# Patient Record
Sex: Female | Born: 1985
Health system: Southern US, Community
[De-identification: ages and names within clinical notes are randomized; demographics above are authoritative.]

## PROBLEM LIST (undated history)

## (undated) ENCOUNTER — Inpatient Hospital Stay (HOSPITAL_COMMUNITY): Payer: Self-pay

## (undated) DIAGNOSIS — R519 Headache, unspecified: Secondary | ICD-10-CM

## (undated) DIAGNOSIS — G971 Other reaction to spinal and lumbar puncture: Secondary | ICD-10-CM

## (undated) DIAGNOSIS — K219 Gastro-esophageal reflux disease without esophagitis: Secondary | ICD-10-CM

## (undated) DIAGNOSIS — R51 Headache: Secondary | ICD-10-CM

## (undated) DIAGNOSIS — D649 Anemia, unspecified: Secondary | ICD-10-CM

## (undated) DIAGNOSIS — Z905 Acquired absence of kidney: Secondary | ICD-10-CM

## (undated) HISTORY — DX: Acquired absence of kidney: Z90.5

## (undated) HISTORY — PX: CERVICAL CERCLAGE: SHX1329

## (undated) HISTORY — DX: Headache, unspecified: R51.9

## (undated) HISTORY — DX: Headache: R51

## (undated) HISTORY — DX: Gastro-esophageal reflux disease without esophagitis: K21.9

## (undated) HISTORY — PX: INDUCED ABORTION: SHX677

## (undated) HISTORY — DX: Other reaction to spinal and lumbar puncture: G97.1

---

## 2005-04-30 HISTORY — PX: APPENDECTOMY: SHX54

## 2011-08-11 ENCOUNTER — Inpatient Hospital Stay (HOSPITAL_COMMUNITY)
Admission: AD | Admit: 2011-08-11 | Payer: BLUE CROSS/BLUE SHIELD | Source: Ambulatory Visit | Admitting: Obstetrics and Gynecology

## 2014-04-30 HISTORY — PX: KIDNEY DONATION: SHX685

## 2014-12-30 DIAGNOSIS — Z905 Acquired absence of kidney: Secondary | ICD-10-CM

## 2014-12-30 HISTORY — DX: Acquired absence of kidney: Z90.5

## 2016-12-13 ENCOUNTER — Encounter (INDEPENDENT_AMBULATORY_CARE_PROVIDER_SITE_OTHER): Payer: Self-pay

## 2016-12-13 ENCOUNTER — Other Ambulatory Visit (INDEPENDENT_AMBULATORY_CARE_PROVIDER_SITE_OTHER): Payer: PRIVATE HEALTH INSURANCE

## 2016-12-13 ENCOUNTER — Encounter: Payer: Self-pay | Admitting: Gastroenterology

## 2016-12-13 ENCOUNTER — Ambulatory Visit (INDEPENDENT_AMBULATORY_CARE_PROVIDER_SITE_OTHER): Payer: PRIVATE HEALTH INSURANCE | Admitting: Gastroenterology

## 2016-12-13 VITALS — BP 104/50 | HR 76 | Ht 65.0 in | Wt 160.4 lb

## 2016-12-13 DIAGNOSIS — R1032 Left lower quadrant pain: Secondary | ICD-10-CM

## 2016-12-13 DIAGNOSIS — R142 Eructation: Secondary | ICD-10-CM

## 2016-12-13 DIAGNOSIS — R194 Change in bowel habit: Secondary | ICD-10-CM | POA: Diagnosis not present

## 2016-12-13 DIAGNOSIS — R112 Nausea with vomiting, unspecified: Secondary | ICD-10-CM

## 2016-12-13 LAB — CBC WITH DIFFERENTIAL/PLATELET
BASOS ABS: 0 10*3/uL (ref 0.0–0.1)
Basophils Relative: 0.6 % (ref 0.0–3.0)
EOS ABS: 0.2 10*3/uL (ref 0.0–0.7)
Eosinophils Relative: 3 % (ref 0.0–5.0)
HCT: 35 % — ABNORMAL LOW (ref 36.0–46.0)
Hemoglobin: 11.4 g/dL — ABNORMAL LOW (ref 12.0–15.0)
LYMPHS ABS: 2.1 10*3/uL (ref 0.7–4.0)
LYMPHS PCT: 31.1 % (ref 12.0–46.0)
MCHC: 32.6 g/dL (ref 30.0–36.0)
MCV: 83.8 fl (ref 78.0–100.0)
MONOS PCT: 10.4 % (ref 3.0–12.0)
Monocytes Absolute: 0.7 10*3/uL (ref 0.1–1.0)
NEUTROS PCT: 54.9 % (ref 43.0–77.0)
Neutro Abs: 3.7 10*3/uL (ref 1.4–7.7)
Platelets: 328 10*3/uL (ref 150.0–400.0)
RBC: 4.17 Mil/uL (ref 3.87–5.11)
RDW: 14.1 % (ref 11.5–15.5)
WBC: 6.7 10*3/uL (ref 4.0–10.5)

## 2016-12-13 LAB — COMPREHENSIVE METABOLIC PANEL
ALK PHOS: 63 U/L (ref 39–117)
ALT: 12 U/L (ref 0–35)
AST: 12 U/L (ref 0–37)
Albumin: 3.8 g/dL (ref 3.5–5.2)
BILIRUBIN TOTAL: 0.2 mg/dL (ref 0.2–1.2)
BUN: 10 mg/dL (ref 6–23)
CO2: 24 mEq/L (ref 19–32)
CREATININE: 0.89 mg/dL (ref 0.40–1.20)
Calcium: 8.9 mg/dL (ref 8.4–10.5)
Chloride: 107 mEq/L (ref 96–112)
GFR: 93.97 mL/min (ref >60.00)
GLUCOSE: 87 mg/dL (ref 70–99)
Potassium: 3.9 mEq/L (ref 3.5–5.1)
Sodium: 136 mEq/L (ref 135–145)
TOTAL PROTEIN: 6.8 g/dL (ref 6.0–8.3)

## 2016-12-13 LAB — H. PYLORI ANTIBODY, IGG: H Pylori IgG: NEGATIVE

## 2016-12-13 MED ORDER — ONDANSETRON HCL 4 MG PO TABS: 4.0000 mg | ORAL_TABLET | Freq: Three times a day (TID) | ORAL | 1 refills | Status: DC | PRN

## 2016-12-13 MED ORDER — NA SULFATE-K SULFATE-MG SULF 17.5-3.13-1.6 GM/177ML PO SOLN: 1.0000 | Freq: Once | ORAL | 0 refills | Status: AC

## 2016-12-13 NOTE — Patient Instructions (Signed)
If you are age 33 or older, your body mass index should be between 23-30. Your Body mass index is 26.69 kg/m. If this is out of the aforementioned range listed, please consider follow up with your Primary Care Provider.  If you are age 33 or younger, your body mass index should be between 19-25. Your Body mass index is 26.69 kg/m. If this is out of the aformentioned range listed, please consider follow up with your Primary Care Provider.   You have been scheduled for an endoscopy and colonoscopy. Please follow the written instructions given to you at your visit today. Please pick up your prep supplies at the pharmacy within the next 1-3 days. If you use inhalers (even only as needed), please bring them with you on the day of your procedure. Your physician has requested that you go to www.startemmi.com and enter the access code given to you at your visit today. This web site gives a general overview about your procedure. However, you should still follow specific instructions given to you by our office regarding your preparation for the procedure.  Thank you for choosing [facility]  Dr Amada JupiterHenry Danis III

## 2016-12-13 NOTE — Progress Notes (Addendum)
[facility] Consult Note:  History: Rita Collier 12/13/2016  Referring physician: self referred  Reason for consult/chief complaint: burping (every day all day); Abdominal Pain (LLQ); Bloated (most days); Gas (every day); Nausea; and Emesis   Subjective  HPI:  This is a 31 year old woman self-referred to establish care for a multitude of chronic GI symptoms. She and her husband recently relocated here while he is studying Radio broadcast assistant. She reports the onset several years ago of abdominal bloating, left lower quadrant pain, alternating bowel habits, frequent and almost constant belching, increased gas and flatulence. She recalls the belching getting much worse after surgery to donate a kidney in 2016. She recalls being sent to a GI doctor who gave her some dietary advice but did note testing. Rita Collier recently has felt somewhat more constipated with feelings of incomplete evacuation. There's been no rectal bleeding. In addition, she wonders if it is related that her menstrual cycle seemed to have slowed down from every 28 to about every 42 days over the last several months. A few days ago she started becoming somewhat more nauseated and had a few episodes of bilious vomiting.  ROS:  Review of Systems  Constitutional: Negative for appetite change and unexpected weight change.  HENT: Negative for mouth sores and voice change.   Eyes: Negative for pain and redness.  Respiratory: Negative for cough and shortness of breath.   Cardiovascular: Negative for chest pain and palpitations.  Genitourinary: Negative for dysuria and hematuria.  Musculoskeletal: Negative for arthralgias and myalgias.  Skin: Negative for pallor and rash.  Neurological: Negative for weakness and headaches.  Hematological: Negative for adenopathy.     Past Medical History: Past Medical History:  Diagnosis Date  . GERD (gastroesophageal reflux disease)   . Hx of kidney removal 12/2014      Past Surgical History: Past Surgical History:  Procedure Laterality Date  . APPENDECTOMY  2007  . KIDNEY DONATION  2016     Family History: Family History  Problem Relation Age of Onset  . Stomach cancer Maternal Aunt   . Pancreatic cancer Maternal Uncle     Social History: Social History   Social History  . Marital status: Married    Spouse name: N/A  . Number of children: N/A  . Years of education: N/A   Social History Main Topics  . Smoking status: Never Smoker  . Smokeless tobacco: Never Used  . Alcohol use Yes     Comment: 1 to 2 drinks in a week  . Drug use: Unknown  . Sexual activity: Yes   Other Topics Concern  . None   Social History Narrative  . None    Allergies: Not on File  Outpatient Meds: Current Outpatient Prescriptions  Medication Sig Dispense Refill  . omeprazole (PRILOSEC) 20 MG capsule Take 20 mg by mouth daily.    . Na Sulfate-K Sulfate-Mg Sulf 17.5-3.13-1.6 GM/180ML SOLN Take 1 kit by mouth once. 354 mL 0  . ondansetron (ZOFRAN) 4 MG tablet Take 1 tablet (4 mg total) by mouth every 8 (eight) hours as needed for nausea or vomiting. 30 tablet 1   No current facility-administered medications for this visit.    Omeprazole had not been helpful in the past for these symptoms, and it has not helped over the last several days since she resumed it.   ___________________________________________________________________ Objective   Exam:  BP (!) 104/50   Pulse 76   Ht _0  (1.651 m)   Wt 160 lb  6.4 oz (72.8 kg)   LMP 11/03/2016 (Exact Date)   BMI 26.69 kg/m  Her husband is present for the entire encounter. [name] belches several times during the visit  General: this is a(n) young woman who does not appear malnourished and is in no acute distress, though with a somewhat depressed affect, perhaps from not feeling well.   Eyes: sclera anicteric, no redness  ENT: oral mucosa moist without lesions, no cervical or supraclavicular  lymphadenopathy, good dentition  CV: RRR without murmur, S1/S2, no JVD, no peripheral edema  Resp: clear to auscultation bilaterally, normal RR and effort noted  GI: soft, mild bilateral lower tenderness, with active bowel sounds. No guarding or palpable organomegaly noted.  Skin; warm and dry, no rash or jaundice noted  Neuro: awake, alert and oriented x 3. Normal gross motor function and fluent speech  No data for review  Assessment: Encounter Diagnoses  Name Primary?  Marland Kitchen LLQ abdominal pain Yes  . Belching   . Non-intractable vomiting with nausea, unspecified vomiting type   . Altered bowel habits     She has a constellation of GI symptoms that are difficult to put together in a single diagnosis. Overall, I think this favors a functional bowel disorder. Things seem to have worsened lately for unclear reasons and her apparently having a significant impact on her life. Therefore, further workup will be undertaken to look for H. pylori, gastric ulcer, celiac sprue, inflammatory bowel disease or, less likely, neoplasia.  Plan:  CBC, CMP, H. pylori antibody EGD and colonoscopy  The benefits and risks of the planned procedure were described in detail with the patient or (when appropriate) their health care proxy.  Risks were outlined as including, but not limited to, bleeding, infection, perforation, adverse medication reaction leading to cardiac or pulmonary decompensation, or pancreatitis (if ERCP).  The limitation of incomplete mucosal visualization was also discussed.  No guarantees or warranties were given.  Zofran 4 mg every 8 hours as needed  Nelida Meuse III

## 2016-12-26 ENCOUNTER — Encounter: Payer: PRIVATE HEALTH INSURANCE | Admitting: Gastroenterology

## 2017-01-10 ENCOUNTER — Emergency Department (HOSPITAL_COMMUNITY)
Admission: EM | Admit: 2017-01-10 | Discharge: 2017-01-11 | Disposition: A | Discharge status: Home / Self Care | Payer: 59 | Attending: Emergency Medicine | Admitting: Emergency Medicine

## 2017-01-10 ENCOUNTER — Encounter (HOSPITAL_COMMUNITY): Payer: Self-pay

## 2017-01-10 DIAGNOSIS — Z885 Allergy status to narcotic agent status: Secondary | ICD-10-CM | POA: Diagnosis not present

## 2017-01-10 DIAGNOSIS — Z3A10 10 weeks gestation of pregnancy: Secondary | ICD-10-CM | POA: Insufficient documentation

## 2017-01-10 DIAGNOSIS — O210 Mild hyperemesis gravidarum: Secondary | ICD-10-CM | POA: Diagnosis not present

## 2017-01-10 DIAGNOSIS — R1110 Vomiting, unspecified: Secondary | ICD-10-CM | POA: Diagnosis present

## 2017-01-10 LAB — COMPREHENSIVE METABOLIC PANEL
ALBUMIN: 3.6 g/dL (ref 3.5–5.0)
ALK PHOS: 54 U/L (ref 38–126)
ALT: 15 U/L (ref 14–54)
ANION GAP: 7 (ref 5–15)
AST: 17 U/L (ref 15–41)
BUN: 9 mg/dL (ref 6–20)
CO2: 20 mmol/L — AB (ref 22–32)
Calcium: 9.2 mg/dL (ref 8.9–10.3)
Chloride: 108 mmol/L (ref 101–111)
Creatinine, Ser: 0.93 mg/dL (ref 0.44–1.00)
GFR calc Af Amer: >60 mL/min (ref >60)
GFR calc non Af Amer: >60 mL/min (ref >60)
GLUCOSE: 83 mg/dL (ref 65–99)
POTASSIUM: 3.9 mmol/L (ref 3.5–5.1)
SODIUM: 135 mmol/L (ref 135–145)
Total Bilirubin: 0.5 mg/dL (ref 0.3–1.2)
Total Protein: 7.3 g/dL (ref 6.5–8.1)

## 2017-01-10 LAB — URINALYSIS, ROUTINE W REFLEX MICROSCOPIC
BILIRUBIN URINE: NEGATIVE
GLUCOSE, UA: NEGATIVE mg/dL
HGB URINE DIPSTICK: NEGATIVE
Ketones, ur: 5 mg/dL — AB
Leukocytes, UA: NEGATIVE
Nitrite: NEGATIVE
PH: 6 (ref 5.0–8.0)
Protein, ur: NEGATIVE mg/dL
SPECIFIC GRAVITY, URINE: 1.011 (ref 1.005–1.030)

## 2017-01-10 LAB — CBC
HCT: 35.5 % — ABNORMAL LOW (ref 36.0–46.0)
HEMOGLOBIN: 11.7 g/dL — AB (ref 12.0–15.0)
MCH: 26.8 pg (ref 26.0–34.0)
MCHC: 33 g/dL (ref 30.0–36.0)
MCV: 81.4 fL (ref 78.0–100.0)
Platelets: 337 10*3/uL (ref 150–400)
RBC: 4.36 MIL/uL (ref 3.87–5.11)
RDW: 13.6 % (ref 11.5–15.5)
WBC: 10.1 10*3/uL (ref 4.0–10.5)

## 2017-01-10 LAB — HCG, QUANTITATIVE, PREGNANCY: hCG, Beta Chain, Quant, S: 255512 m[IU]/mL — ABNORMAL HIGH (ref <5)

## 2017-01-10 LAB — I-STAT BETA HCG BLOOD, ED (MC, WL, AP ONLY): I-stat hCG, quantitative: >2000 m[IU]/mL — ABNORMAL HIGH (ref <5)

## 2017-01-10 LAB — LIPASE, BLOOD: Lipase: 79 U/L — ABNORMAL HIGH (ref 11–51)

## 2017-01-10 MED ORDER — SODIUM CHLORIDE 0.9 % IV BOLUS (SEPSIS)
1000.0000 mL | Freq: Once | INTRAVENOUS | Status: AC
  Administered 2017-01-10: 1000 mL via INTRAVENOUS

## 2017-01-10 MED ORDER — PROMETHAZINE HCL 25 MG/ML IJ SOLN
6.2500 mg | Freq: Once | INTRAMUSCULAR | Status: AC
  Administered 2017-01-10: 6.25 mg via INTRAVENOUS
  Filled 2017-01-10: qty 1

## 2017-01-10 NOTE — ED Provider Notes (Signed)
[facility] Provider Note   CSN: [id number] Arrival date & time: 01/10/17  1806     History   Chief Complaint Chief Complaint  Patient presents with  . Emesis    HPI Rita Collier is a 31 y.o. female.  HPI Patient is around [redacted] weeks pregnant. Has seen OB for it. Has had ultrasound. States for most a pregnancy she has been sick. Nausea and vomiting with occasional diarrhea. States she's keeping hot foods down but not really keep much liquids. She's tried doing just little bit of it. Has been on likely just without much help. She has some dull abdominal pain. States she feels as if she is more dehydrated headache now. Slight abdominal pain. No sick contacts. Is not feeling baby move yet. States she cannot tell if what she's feeling his diarrhea or the baby. Past Medical History:  Diagnosis Date  . GERD (gastroesophageal reflux disease)   . Hx of kidney removal 12/2014    There are no active problems to display for this patient.   Past Surgical History:  Procedure Laterality Date  . APPENDECTOMY  2007  . KIDNEY DONATION  2016    OB History    Gravida Para Term Preterm AB Living   1             SAB TAB Ectopic Multiple Live Births                   Home Medications    Prior to Admission medications   Medication Sig Start Date End Date Taking? Authorizing Provider  DICLEGIS 10-10 MG TBEC Take 10 mg by mouth. 01/01/17  Yes [provider]  ranitidine (ZANTAC) 150 MG tablet Take 150 mg by mouth daily.   Yes [provider]  promethazine (PHENERGAN) 25 MG tablet Take 1 tablet (25 mg total) by mouth every 6 (six) hours as needed for nausea or vomiting. 01/11/17   Benjiman Core, MD    Family History Family History  Problem Relation Age of Onset  . Stomach cancer Maternal Aunt   . Pancreatic cancer Maternal Uncle     Social History Social History  Substance Use Topics  . Smoking status: Never Smoker  . Smokeless tobacco: Never Used  .  Alcohol use Yes     Comment: occ when not pregnant     Allergies   Vicodin [hydrocodone-acetaminophen]   Review of Systems Review of Systems  Constitutional: Positive for appetite change.  Gastrointestinal: Negative for abdominal pain.  Genitourinary: Negative for vaginal bleeding and vaginal discharge.     Physical Exam Updated Vital Signs BP 108/67 (BP Location: Right Arm)   Pulse 95   Temp 98.3 F (36.8 C) (Oral)   Resp 18   Ht  (1.651 m)   Wt 72.6 kg (160 lb)   LMP 11/03/2016 (Exact Date)   SpO2 100%   BMI 26.63 kg/m   Physical Exam  Constitutional: She appears well-developed.  HENT:  Head: Atraumatic.  Eyes: Pupils are equal, round, and reactive to light.  Neck: Neck supple.  Cardiovascular: Normal rate.   Abdominal: There is no tenderness.  Musculoskeletal: Normal range of motion. She exhibits no edema.  Neurological: She is alert.  Skin: Skin is warm. Capillary refill takes less than 2 seconds.  Psychiatric: She has a normal mood and affect.     ED Treatments / Results  Labs (all labs ordered are listed, but only abnormal results are displayed) Labs Reviewed  LIPASE, BLOOD -  Abnormal; Notable for the following:       Result Value   Lipase 79 (*)    All other components within normal limits  COMPREHENSIVE METABOLIC PANEL - Abnormal; Notable for the following:    CO2 20 (*)    All other components within normal limits  CBC - Abnormal; Notable for the following:    Hemoglobin 11.7 (*)    HCT 35.5 (*)    All other components within normal limits  URINALYSIS, ROUTINE W REFLEX MICROSCOPIC - Abnormal; Notable for the following:    Color, Urine STRAW (*)    Ketones, ur 5 (*)    All other components within normal limits  HCG, QUANTITATIVE, PREGNANCY - Abnormal; Notable for the following:    hCG, Beta Chain, Quant, S [id number],512 (*)    All other components within normal limits  I-STAT BETA HCG BLOOD, ED ([facility], [facility], [facility] ONLY) - Abnormal; Notable for the  following:    I-stat hCG, quantitative >2,000.0 (*)    All other components within normal limits    EKG  EKG Interpretation None       Radiology No results found.  Procedures Procedures (including critical care time)  Medications Ordered in ED Medications  sodium chloride 0.9 % bolus 1,000 mL (0 mLs Intravenous Stopped 01/10/17 2351)  sodium chloride 0.9 % bolus 1,000 mL (0 mLs Intravenous Stopped 01/10/17 2351)  promethazine (PHENERGAN) injection 6.25 mg (6.25 mg Intravenous Given 01/10/17 2307)     Initial Impression / Assessment and Plan / ED Course  I have reviewed the triage vital signs and the nursing notes.  Pertinent labs & imaging results that were available during my care of the patient were reviewed by me and considered in my medical decision making (see chart for details).     Patient with nausea and vomiting. [redacted] weeks pregnant. Has not been able keep up at home. Labs reassuring but appears to be a hyperemesis. Has not done well with Diclegis. Feels better after some IV fluid. Does tolerate some orals and hopefully will be able be discharged home. Will give Phenergan since she has not been doing well with the other antiemetics. Follow-up with her OB.  Fin obstetrician.al Clinical Impressions(s) / ED Diagnoses   Final diagnoses:  Hyperemesis gravidarum    New Prescriptions New Prescriptions   PROMETHAZINE (PHENERGAN) 25 MG TABLET    Take 1 tablet (25 mg total) by mouth every 6 (six) hours as needed for nausea or vomiting.     Benjiman CorePickering, Xena Propst, MD 01/11/17 [phone]

## 2017-01-10 NOTE — ED Triage Notes (Signed)
Pt endorses being [redacted] weeks pregnant, pt has been unable to keep water down today and has states "I've been sick my whole pregnancy so far" Pt was able to keep food down but no fluids. VSS.

## 2017-01-11 MED ORDER — PROMETHAZINE HCL 25 MG PO TABS: 25.0000 mg | ORAL_TABLET | Freq: Four times a day (QID) | ORAL | 0 refills | Status: DC | PRN

## 2017-01-11 NOTE — ED Provider Notes (Signed)
Care assumed from Dr. Rubin Payor MD, at shift change, please see their notes for full documentation of patient's complaint/HPI. Briefly, pt here with hyperemesis gravidarum in early pregnancy. Results so far show U/A with few ketones but no evidence of infection; lipase marginally elevated at 79; CMP essentially WNL aside from mildly low bicarb 20; CBC with mild anemia which is stable; and quantHCG 255,512. Awaiting pt to receive second liter of fluids and as long as she's able to tolerate PO then she can be discharged with phenergan rx and OBGYN f/up.   Physical Exam  BP 108/67 (BP Location: Right Arm)   Pulse 95   Temp 98.3 F (36.8 C) (Oral)   Resp 18   Ht  (1.651 m)   Wt 72.6 kg (160 lb)   LMP 11/03/2016 (Exact Date)   SpO2 100%   BMI 26.63 kg/m   Physical Exam Gen: afebrile, VSS, NAD HEENT: EOMI, MMM Resp: no resp distress CV: rate WNL Abd: appearance normal, nondistended MsK: moving all extremities with ease Neuro: A&O x4  ED Course  Procedures Results for orders placed or performed during the hospital encounter of 01/10/17  Lipase, blood  Result Value Ref Range   Lipase 79 (H) 11 - 51 U/L  Comprehensive metabolic panel  Result Value Ref Range   Sodium 135 135 - 145 mmol/L   Potassium 3.9 3.5 - 5.1 mmol/L   Chloride 108 101 - 111 mmol/L   CO2 20 (L) 22 - 32 mmol/L   Glucose, Bld 83 65 - 99 mg/dL   BUN 9 6 - 20 mg/dL   Creatinine, Ser 4.74 0.44 - 1.00 mg/dL   Calcium 9.2 8.9 - 25.9 mg/dL   Total Protein 7.3 6.5 - 8.1 g/dL   Albumin 3.6 3.5 - 5.0 g/dL   AST 17 15 - 41 U/L   ALT 15 14 - 54 U/L   Alkaline Phosphatase 54 38 - 126 U/L   Total Bilirubin 0.5 0.3 - 1.2 mg/dL   GFR calc non Af Amer >60 >60 mL/min   GFR calc Af Amer >60 >60 mL/min   Anion gap 7 5 - 15  CBC  Result Value Ref Range   WBC 10.1 4.0 - 10.5 K/uL   RBC 4.36 3.87 - 5.11 MIL/uL   Hemoglobin 11.7 (L) 12.0 - 15.0 g/dL   HCT 56.3 (L) 87.5 - 64.3 %   MCV 81.4 78.0 - 100.0 fL   MCH 26.8 26.0  - 34.0 pg   MCHC 33.0 30.0 - 36.0 g/dL   RDW 32.9 51.8 - 84.1 %   Platelets 337 150 - 400 K/uL  Urinalysis, Routine w reflex microscopic  Result Value Ref Range   Color, Urine STRAW (A) YELLOW   APPearance CLEAR CLEAR   Specific Gravity, Urine 1.011 1.005 - 1.030   pH 6.0 5.0 - 8.0   Glucose, UA NEGATIVE NEGATIVE mg/dL   Hgb urine dipstick NEGATIVE NEGATIVE   Bilirubin Urine NEGATIVE NEGATIVE   Ketones, ur 5 (A) NEGATIVE mg/dL   Protein, ur NEGATIVE NEGATIVE mg/dL   Nitrite NEGATIVE NEGATIVE   Leukocytes, UA NEGATIVE NEGATIVE  hCG, quantitative, pregnancy  Result Value Ref Range   hCG, Beta Chain, Quant, S 255,512 (H) <5 mIU/mL  I-Stat beta hCG blood, ED  Result Value Ref Range   I-stat hCG, quantitative >2,000.0 (H) <5 mIU/mL   Comment 3           No results found.   Meds ordered this encounter  Medications  .  DICLEGIS 10-10 MG TBEC    Sig: Take 10 mg by mouth.    Refill:  0  . ranitidine (ZANTAC) 150 MG tablet    Sig: Take 150 mg by mouth daily.  . sodium chloride 0.9 % bolus 1,000 mL  . sodium chloride 0.9 % bolus 1,000 mL  . promethazine (PHENERGAN) injection 6.25 mg  . promethazine (PHENERGAN) 25 MG tablet    Sig: Take 1 tablet (25 mg total) by mouth every 6 (six) hours as needed for nausea or vomiting.    Dispense:  20 tablet    Refill:  0     MDM:   ICD-10-CM   1. Hyperemesis gravidarum O21.0     12:47 AM Second liter of fluids finished, pt tolerating PO well. D/c home with previously outlined plan per Dr. Rubin Payor. F/up with OBGYN in 2-3 days for ongoing management and recheck of symptoms. Phenergan rx written by Dr. Rubin Payor. I explained the diagnosis and have given explicit precautions to return to the ER including for any other new or worsening symptoms. The patient understands and accepts the medical plan as it's been dictated and I have answered their questions. Discharge instructions concerning home care and prescriptions have been given. The patient  is STABLE and is discharged to home in good condition.     [address], [place], [place] 01/11/17 1610    Benjiman Core, MD 01/13/17 [phone]

## 2017-01-11 NOTE — Discharge Instructions (Addendum)
Use phenergan as directed as needed for nausea. Stay well hydrated. Follow up with your OBGYN in 2-3 days for recheck of symptoms. Use the list of foods below to help with nausea/vomiting of pregnancy. [facility] for emergent changes or worsening symptoms.

## 2017-01-11 NOTE — ED Notes (Signed)
Pt given PO fluids per EDP order. Pt tolerating fluids well. Showing NAD.

## 2017-01-31 LAB — OB RESULTS CONSOLE ABO/RH: RH Type: POSITIVE

## 2017-01-31 LAB — OB RESULTS CONSOLE HIV ANTIBODY (ROUTINE TESTING): HIV: NONREACTIVE

## 2017-01-31 LAB — OB RESULTS CONSOLE GC/CHLAMYDIA
CHLAMYDIA, DNA PROBE: NEGATIVE
Gonorrhea: NEGATIVE

## 2017-01-31 LAB — OB RESULTS CONSOLE RPR: RPR: NONREACTIVE

## 2017-01-31 LAB — OB RESULTS CONSOLE RUBELLA ANTIBODY, IGM: Rubella: IMMUNE

## 2017-01-31 LAB — OB RESULTS CONSOLE ANTIBODY SCREEN: Antibody Screen: NEGATIVE

## 2017-01-31 LAB — OB RESULTS CONSOLE HEPATITIS B SURFACE ANTIGEN: HEP B S AG: NEGATIVE

## 2017-04-18 DIAGNOSIS — O3432 Maternal care for cervical incompetence, second trimester: Secondary | ICD-10-CM | POA: Diagnosis not present

## 2017-04-18 DIAGNOSIS — Z3A23 23 weeks gestation of pregnancy: Secondary | ICD-10-CM | POA: Diagnosis not present

## 2017-04-20 DIAGNOSIS — G971 Other reaction to spinal and lumbar puncture: Secondary | ICD-10-CM | POA: Diagnosis not present

## 2017-04-30 NOTE — L&D Delivery Note (Signed)
Delivery Note At 2:13 PM a viable female was delivered via Vaginal, Spontaneous (Presentation: LOA).  APGAR: 9, 9; weight pending.   Placenta status: S, I. 3V Cord  with the following complications: loose nuchal x 1, reduced.  Cord pH: n/a  Anesthesia:  CLEA Episiotomy: None Lacerations: None Suture Repair: n/a Est. Blood Loss (mL): 150  Mom to postpartum.  Baby to Couplet care / Skin to Skin.  Meoshia Billing 08/06/2017, 2:38 PM

## 2017-05-01 DIAGNOSIS — O3432 Maternal care for cervical incompetence, second trimester: Secondary | ICD-10-CM | POA: Diagnosis not present

## 2017-05-01 DIAGNOSIS — D509 Iron deficiency anemia, unspecified: Secondary | ICD-10-CM | POA: Diagnosis not present

## 2017-05-01 DIAGNOSIS — Z3A25 25 weeks gestation of pregnancy: Secondary | ICD-10-CM | POA: Diagnosis not present

## 2017-05-01 DIAGNOSIS — O26872 Cervical shortening, second trimester: Secondary | ICD-10-CM | POA: Diagnosis not present

## 2017-05-01 DIAGNOSIS — Z3A24 24 weeks gestation of pregnancy: Secondary | ICD-10-CM | POA: Diagnosis not present

## 2017-05-01 DIAGNOSIS — O99012 Anemia complicating pregnancy, second trimester: Secondary | ICD-10-CM | POA: Diagnosis not present

## 2017-05-01 DIAGNOSIS — D649 Anemia, unspecified: Secondary | ICD-10-CM | POA: Diagnosis not present

## 2017-05-02 DIAGNOSIS — Z3A24 24 weeks gestation of pregnancy: Secondary | ICD-10-CM | POA: Diagnosis not present

## 2017-05-02 DIAGNOSIS — O99012 Anemia complicating pregnancy, second trimester: Secondary | ICD-10-CM | POA: Diagnosis not present

## 2017-05-02 DIAGNOSIS — O26872 Cervical shortening, second trimester: Secondary | ICD-10-CM | POA: Diagnosis not present

## 2017-05-02 DIAGNOSIS — D649 Anemia, unspecified: Secondary | ICD-10-CM | POA: Diagnosis not present

## 2017-05-10 DIAGNOSIS — O3432 Maternal care for cervical incompetence, second trimester: Secondary | ICD-10-CM | POA: Diagnosis not present

## 2017-05-10 DIAGNOSIS — Z23 Encounter for immunization: Secondary | ICD-10-CM | POA: Diagnosis not present

## 2017-05-10 DIAGNOSIS — Z3A26 26 weeks gestation of pregnancy: Secondary | ICD-10-CM | POA: Diagnosis not present

## 2017-05-10 DIAGNOSIS — O26872 Cervical shortening, second trimester: Secondary | ICD-10-CM | POA: Diagnosis not present

## 2017-05-16 ENCOUNTER — Inpatient Hospital Stay (HOSPITAL_COMMUNITY)
Admission: AD | Admit: 2017-05-16 | Discharge: 2017-05-16 | Disposition: A | Discharge status: Home / Self Care | Payer: BLUE CROSS/BLUE SHIELD | Source: Ambulatory Visit | Attending: Obstetrics and Gynecology | Admitting: Obstetrics and Gynecology

## 2017-05-16 ENCOUNTER — Other Ambulatory Visit (HOSPITAL_COMMUNITY): Payer: Self-pay | Admitting: Obstetrics and Gynecology

## 2017-05-16 ENCOUNTER — Encounter (HOSPITAL_COMMUNITY): Payer: Self-pay | Admitting: *Deleted

## 2017-05-16 DIAGNOSIS — O26872 Cervical shortening, second trimester: Secondary | ICD-10-CM | POA: Diagnosis not present

## 2017-05-16 DIAGNOSIS — Z3480 Encounter for supervision of other normal pregnancy, unspecified trimester: Secondary | ICD-10-CM | POA: Diagnosis not present

## 2017-05-16 DIAGNOSIS — O3432 Maternal care for cervical incompetence, second trimester: Secondary | ICD-10-CM | POA: Diagnosis not present

## 2017-05-16 DIAGNOSIS — Z3A27 27 weeks gestation of pregnancy: Secondary | ICD-10-CM | POA: Diagnosis not present

## 2017-05-16 DIAGNOSIS — Z3493 Encounter for supervision of normal pregnancy, unspecified, third trimester: Secondary | ICD-10-CM | POA: Diagnosis not present

## 2017-05-16 DIAGNOSIS — Z3A28 28 weeks gestation of pregnancy: Secondary | ICD-10-CM | POA: Insufficient documentation

## 2017-05-16 MED ORDER — BETAMETHASONE SOD PHOS & ACET 6 (3-3) MG/ML IJ SUSP
12.0000 mg | INTRAMUSCULAR | Status: DC
  Administered 2017-05-16: 12 mg via INTRAMUSCULAR
  Filled 2017-05-16 (×2): qty 2

## 2017-05-16 NOTE — MAU Note (Signed)
Office had called, pt sent in for Betamethasone only.  To call MD when arrived for orders.

## 2017-05-17 ENCOUNTER — Inpatient Hospital Stay (HOSPITAL_COMMUNITY)
Admission: AD | Admit: 2017-05-17 | Discharge: 2017-05-17 | Disposition: A | Discharge status: Home / Self Care | Payer: BLUE CROSS/BLUE SHIELD | Source: Ambulatory Visit | Attending: Obstetrics and Gynecology | Admitting: Obstetrics and Gynecology

## 2017-05-17 DIAGNOSIS — Z3483 Encounter for supervision of other normal pregnancy, third trimester: Secondary | ICD-10-CM | POA: Insufficient documentation

## 2017-05-17 DIAGNOSIS — Z3A30 30 weeks gestation of pregnancy: Secondary | ICD-10-CM | POA: Insufficient documentation

## 2017-05-17 MED ORDER — BETAMETHASONE SOD PHOS & ACET 6 (3-3) MG/ML IJ SUSP
12.0000 mg | Freq: Once | INTRAMUSCULAR | Status: AC
  Administered 2017-05-17: 12 mg via INTRAMUSCULAR
  Filled 2017-05-17: qty 2

## 2017-05-17 NOTE — MAU Note (Signed)
Here for 2nd injection.  Did ok yesterday, burned a couple hrs.  Feeling much better today.

## 2017-05-21 DIAGNOSIS — O26873 Cervical shortening, third trimester: Secondary | ICD-10-CM | POA: Diagnosis not present

## 2017-05-21 DIAGNOSIS — Z3A28 28 weeks gestation of pregnancy: Secondary | ICD-10-CM | POA: Diagnosis not present

## 2017-05-29 DIAGNOSIS — D649 Anemia, unspecified: Secondary | ICD-10-CM | POA: Diagnosis not present

## 2017-05-29 DIAGNOSIS — Z3A29 29 weeks gestation of pregnancy: Secondary | ICD-10-CM | POA: Diagnosis not present

## 2017-05-29 DIAGNOSIS — O3433 Maternal care for cervical incompetence, third trimester: Secondary | ICD-10-CM | POA: Diagnosis not present

## 2017-06-03 ENCOUNTER — Telehealth: Payer: Self-pay | Admitting: *Deleted

## 2017-06-03 NOTE — Telephone Encounter (Signed)
Received a voice mail from 06/01/17 am stating she missed a call from someone from this number and they did not leave a message.

## 2017-06-03 NOTE — Telephone Encounter (Signed)
I called Breyah back and informed her I do not see that a clinical staff or provider called her from our office , but I do see that she has a new ob appt. She states she has a new ob appt with [facility] ; but was having referrals made and they were having trouble making sure she got to the right office. States she is supposed to be going to [facility] and to MFM. I informed her we are not MFM ; we do get referrals .  She states she is going to check and if she is not coming here will call back to cancel.

## 2017-06-06 DIAGNOSIS — O368130 Decreased fetal movements, third trimester, not applicable or unspecified: Secondary | ICD-10-CM | POA: Diagnosis not present

## 2017-06-06 DIAGNOSIS — Z3A30 30 weeks gestation of pregnancy: Secondary | ICD-10-CM | POA: Diagnosis not present

## 2017-06-06 DIAGNOSIS — O26873 Cervical shortening, third trimester: Secondary | ICD-10-CM | POA: Diagnosis not present

## 2017-06-07 ENCOUNTER — Encounter: Payer: Self-pay | Admitting: *Deleted

## 2017-06-12 ENCOUNTER — Encounter: Payer: Self-pay | Admitting: Obstetrics and Gynecology

## 2017-06-18 ENCOUNTER — Inpatient Hospital Stay (HOSPITAL_COMMUNITY)
Admission: AD | Admit: 2017-06-18 | Discharge: 2017-06-18 | Disposition: A | Discharge status: Home / Self Care | Payer: BLUE CROSS/BLUE SHIELD | Source: Ambulatory Visit | Attending: Obstetrics and Gynecology | Admitting: Obstetrics and Gynecology

## 2017-06-18 ENCOUNTER — Inpatient Hospital Stay (HOSPITAL_COMMUNITY): Payer: BLUE CROSS/BLUE SHIELD

## 2017-06-18 ENCOUNTER — Encounter (HOSPITAL_COMMUNITY): Payer: Self-pay | Admitting: *Deleted

## 2017-06-18 DIAGNOSIS — Z3A32 32 weeks gestation of pregnancy: Secondary | ICD-10-CM | POA: Diagnosis not present

## 2017-06-18 DIAGNOSIS — O9989 Other specified diseases and conditions complicating pregnancy, childbirth and the puerperium: Secondary | ICD-10-CM | POA: Diagnosis not present

## 2017-06-18 DIAGNOSIS — R0602 Shortness of breath: Secondary | ICD-10-CM | POA: Diagnosis not present

## 2017-06-18 DIAGNOSIS — O26893 Other specified pregnancy related conditions, third trimester: Secondary | ICD-10-CM | POA: Diagnosis not present

## 2017-06-18 LAB — COMPREHENSIVE METABOLIC PANEL
ALT: 18 U/L (ref 14–54)
AST: 12 U/L — ABNORMAL LOW (ref 15–41)
Albumin: 3 g/dL — ABNORMAL LOW (ref 3.5–5.0)
Alkaline Phosphatase: 100 U/L (ref 38–126)
Anion gap: 7 (ref 5–15)
BUN: 11 mg/dL (ref 6–20)
CO2: 20 mmol/L — ABNORMAL LOW (ref 22–32)
Calcium: 8.7 mg/dL — ABNORMAL LOW (ref 8.9–10.3)
Chloride: 105 mmol/L (ref 101–111)
Creatinine, Ser: 0.93 mg/dL (ref 0.44–1.00)
GFR calc Af Amer: >60 mL/min (ref >60)
GFR calc non Af Amer: >60 mL/min (ref >60)
Glucose, Bld: 96 mg/dL (ref 65–99)
Potassium: 3.8 mmol/L (ref 3.5–5.1)
Sodium: 132 mmol/L — ABNORMAL LOW (ref 135–145)
Total Bilirubin: 0.3 mg/dL (ref 0.3–1.2)
Total Protein: 7.8 g/dL (ref 6.5–8.1)

## 2017-06-18 LAB — URINALYSIS, ROUTINE W REFLEX MICROSCOPIC
Bilirubin Urine: NEGATIVE
Glucose, UA: NEGATIVE mg/dL
Hgb urine dipstick: NEGATIVE
Ketones, ur: NEGATIVE mg/dL
Leukocytes, UA: NEGATIVE
Nitrite: NEGATIVE
Protein, ur: NEGATIVE mg/dL
Specific Gravity, Urine: 1.01 (ref 1.005–1.030)
pH: 6 (ref 5.0–8.0)

## 2017-06-18 LAB — CBC
HCT: 32 % — ABNORMAL LOW (ref 36.0–46.0)
Hemoglobin: 10.5 g/dL — ABNORMAL LOW (ref 12.0–15.0)
MCH: 26.7 pg (ref 26.0–34.0)
MCHC: 32.8 g/dL (ref 30.0–36.0)
MCV: 81.4 fL (ref 78.0–100.0)
Platelets: 278 10*3/uL (ref 150–400)
RBC: 3.93 MIL/uL (ref 3.87–5.11)
RDW: 15.7 % — ABNORMAL HIGH (ref 11.5–15.5)
WBC: 8.6 10*3/uL (ref 4.0–10.5)

## 2017-06-18 MED ORDER — IOPAMIDOL (ISOVUE-300) INJECTION 61%: 100.0000 mL | Freq: Once | INTRAVENOUS | Status: DC | PRN

## 2017-06-18 MED ORDER — SODIUM CHLORIDE 0.9 % IV SOLN
INTRAVENOUS | Status: DC
  Administered 2017-06-18: 14:00:00 via INTRAVENOUS

## 2017-06-18 MED ORDER — IOPAMIDOL (ISOVUE-370) INJECTION 76%
100.0000 mL | Freq: Once | INTRAVENOUS | Status: AC | PRN
  Administered 2017-06-18: 100 mL via INTRAVENOUS

## 2017-06-18 MED ORDER — ALBUTEROL SULFATE HFA 108 (90 BASE) MCG/ACT IN AERS: 2.0000 | INHALATION_SPRAY | Freq: Four times a day (QID) | RESPIRATORY_TRACT | 0 refills | Status: DC | PRN

## 2017-06-18 MED ORDER — IPRATROPIUM-ALBUTEROL 0.5-2.5 (3) MG/3ML IN SOLN
3.0000 mL | Freq: Once | RESPIRATORY_TRACT | Status: AC
  Administered 2017-06-18: 3 mL via RESPIRATORY_TRACT
  Filled 2017-06-18: qty 3

## 2017-06-18 NOTE — Discharge Instructions (Signed)
Shortness of Breath, Adult  Shortness of breath means you have trouble breathing. Your lungs are organs for breathing.  Follow these instructions at home:  Pay attention to any changes in your symptoms. Take these actions to help with your condition:  ? Do not smoke. Smoking can cause shortness of breath. If you need help to quit smoking, ask your doctor.  ? Avoid things that can make it harder to breathe, such as:  ? Mold.  ? Dust.  ? Air pollution.  ? Chemical smells.  ? Things that can cause allergy symptoms (allergens), if you have allergies.  ? Keep your living space clean and free of mold and dust.  ? Rest as needed. Slowly return to your usual activities.  ? Take over-the-counter and prescription medicines, including oxygen and inhaled medicines, only as told by your doctor.  ? Keep all follow-up visits as told by your doctor. This is important.  Contact a doctor if:  ? Your condition does not get better as soon as expected.  ? You have a hard time doing your normal activities, even after you rest.  ? You have new symptoms.  Get help right away if:  ? You have trouble breathing when you are resting.  ? You feel light-headed or you faint.  ? You have a cough that is not helped by medicines.  ? You cough up blood.  ? You have pain with breathing.  ? You have pain in your chest, arms, shoulders, or belly (abdomen).  ? You have a fever.  ? You cannot walk up stairs.  ? You cannot exercise the way you normally do.  This information is not intended to replace advice given to you by your health care provider. Make sure you discuss any questions you have with your health care provider.  Document Released: 10/03/2007 Document Revised: 05/03/2016 Document Reviewed: 05/03/2016  Elsevier Interactive Patient Education ? 2017 [employer]

## 2017-06-18 NOTE — Progress Notes (Signed)
IV attempt by G. Jaxon Mynhier, RN 

## 2017-06-18 NOTE — MAU Note (Signed)
Pt sent from MD office for evaluation of pt c/o SOB.  Reports SOB has been occurring the past few weeks but has worsened.  Reports SOB is without exertion or activities. Reports being monitored for anemia, Hgb 8.8 @ office visit today.  Currently taking Iron once per day.

## 2017-06-18 NOTE — MAU Provider Note (Signed)
Chief Complaint:  Shortness of Breath   First Provider Initiated Contact with Patient 06/18/17 1310      HPI: Rita Collier is a 32 y.o. G1P0 at 7w3dwho presents to maternity admissions sent over from the office reporting SOB. She reports she has been experience SOB for two weeks but has been worsening since it began. She reports that she has trouble catching her breath while sitting, laying, and standing. She denies chest pain with SOB or history of asthma. She currently has a cerclage in place with strings intact- checked by Dr Henderson Cloud in office. She reports good fetal movement, denies LOF, vaginal bleeding, vaginal itching/burning, urinary symptoms, h/a, dizziness, n/v, or fever/chills.    Past Medical History: Past Medical History:  Diagnosis Date  . GERD (gastroesophageal reflux disease)   . Hx of kidney removal 12/2014    Past obstetric history: OB History  Gravida Para Term Preterm AB Living  1            SAB TAB Ectopic Multiple Live Births               # Outcome Date GA Lbr Len/2nd Weight Sex Delivery Anes PTL Lv  1 Current               Past Surgical History: Past Surgical History:  Procedure Laterality Date  . APPENDECTOMY  2007  . CERVICAL CERCLAGE    . KIDNEY DONATION  2016    Family History: Family History  Problem Relation Age of Onset  . Stomach cancer Maternal Aunt   . Pancreatic cancer Maternal Uncle     Social History: Social History   Tobacco Use  . Smoking status: Never Smoker  . Smokeless tobacco: Never Used  Substance Use Topics  . Alcohol use: Yes    Comment: occ when not pregnant  . Drug use: No    Allergies:  Allergies  Allergen Reactions  . Other Nausea And Vomiting and Other (See Comments)    Iron medication- not sure of name. Arm swelling, hands and feet tingling  . Vicodin [Hydrocodone-Acetaminophen] Nausea Only    Meds:  Medications Prior to Admission  Medication Sig Dispense Refill Last Dose  . acetaminophen (TYLENOL)  325 MG tablet Take 650 mg by mouth every 6 (six) hours as needed for mild pain or headache.   Past Week at Unknown time  . DICLEGIS 10-10 MG TBEC Take 10 mg by mouth every 6 (six) hours as needed (nausea).   0 Past Month at Unknown time  . OVER THE COUNTER MEDICATION Take 1 tablet by mouth daily. Iron supplement- not sure of name or strength   Past Week at Unknown time  . Prenatal Vit-Fe Fumarate-FA (PRENATAL MULTIVITAMIN) TABS tablet Take 1 tablet by mouth daily at 12 noon.   Past Week at Unknown time  . progesterone (PROMETRIUM) 100 MG capsule Place 100 mg vaginally at bedtime.   2 Past Week at Unknown time  . ranitidine (ZANTAC) 150 MG tablet Take 150 mg by mouth daily.   Past Month at Unknown time  . promethazine (PHENERGAN) 25 MG tablet Take 1 tablet (25 mg total) by mouth every 6 (six) hours as needed for nausea or vomiting. (Patient not taking: Reported on 06/18/2017) 20 tablet 0 Not Taking at Unknown time    ROS:  Review of Systems  Constitutional: Negative.   Respiratory: Positive for shortness of breath. Negative for chest tightness and wheezing.   Cardiovascular: Negative for chest pain, palpitations and leg swelling.  Gastrointestinal: Negative.   Genitourinary: Negative.   Musculoskeletal: Negative.   Neurological: Negative.   Psychiatric/Behavioral: Negative.    I have reviewed patient's Past Medical Hx, Surgical Hx, Family Hx, Social Hx, medications and allergies.   Physical Exam   Patient Vitals for the past 24 hrs:  BP Temp Temp src Pulse Resp SpO2  06/18/17 1733 113/67 98.5 F (36.9 C) Oral 93 18 99 %  06/18/17 1642 - - - - - 100 %  06/18/17 1635 - - - - - 100 %  06/18/17 1630 - - - - - 100 %  06/18/17 1623 - - - - - 100 %  06/18/17 1415 - - - - - 100 %  06/18/17 1400 - - - - - 100 %  06/18/17 1315 - - - - - 100 %  06/18/17 1308 - - - - - 100 %  06/18/17 1300 - - - - - 100 %  06/18/17 1256 - - - - - 100 %  06/18/17 1240 (!) 95/58 97.9 F (36.6 C) Oral (!) 106  20 -   Constitutional: Well-developed, well-nourished female in mild acute distress. Patient verbalizes two words then has to pause to catch breath.  Cardiovascular: normal rate, mild tachycardia on arrival to MAU  Respiratory: increased effort, lung sounds clear to auscaltation bilaterally, no wheezing or rales auscultated GI: Abd soft, non-tender, gravid appropriate for gestational age.  MS: Extremities nontender, no edema, normal ROM Neurologic: Alert and oriented x 4.  GU: Neg CVAT. PELVIC EXAM: deferred, just done in office prior to arrival   FHT:  Baseline 145 , moderate variability, accelerations present, no decelerations Contractions: Occasional mild UC with UI   Labs: Results for orders placed or performed during the hospital encounter of 06/18/17 (from the past 24 hour(s))  Urinalysis, Routine w reflex microscopic     Status: None   Collection Time: 06/18/17 12:43 PM  Result Value Ref Range   Color, Urine YELLOW YELLOW   APPearance CLEAR CLEAR   Specific Gravity, Urine 1.010 1.005 - 1.030   pH 6.0 5.0 - 8.0   Glucose, UA NEGATIVE NEGATIVE mg/dL   Hgb urine dipstick NEGATIVE NEGATIVE   Bilirubin Urine NEGATIVE NEGATIVE   Ketones, ur NEGATIVE NEGATIVE mg/dL   Protein, ur NEGATIVE NEGATIVE mg/dL   Nitrite NEGATIVE NEGATIVE   Leukocytes, UA NEGATIVE NEGATIVE  CBC     Status: Abnormal   Collection Time: 06/18/17 12:48 PM  Result Value Ref Range   WBC 8.6 4.0 - 10.5 K/uL   RBC 3.93 3.87 - 5.11 MIL/uL   Hemoglobin 10.5 (L) 12.0 - 15.0 g/dL   HCT 16.1 (L) 09.6 - 04.5 %   MCV 81.4 78.0 - 100.0 fL   MCH 26.7 26.0 - 34.0 pg   MCHC 32.8 30.0 - 36.0 g/dL   RDW 40.9 (H) 81.1 - 91.4 %   Platelets 278 150 - 400 K/uL  Comprehensive metabolic panel     Status: Abnormal   Collection Time: 06/18/17 12:48 PM  Result Value Ref Range   Sodium 132 (L) 135 - 145 mmol/L   Potassium 3.8 3.5 - 5.1 mmol/L   Chloride 105 101 - 111 mmol/L   CO2 20 (L) 22 - 32 mmol/L   Glucose, Bld 96 65 -  99 mg/dL   BUN 11 6 - 20 mg/dL   Creatinine, Ser 7.82 0.44 - 1.00 mg/dL   Calcium 8.7 (L) 8.9 - 10.3 mg/dL   Total Protein 7.8 6.5 - 8.1 g/dL  Albumin 3.0 (L) 3.5 - 5.0 g/dL   AST 12 (L) 15 - 41 U/L   ALT 18 14 - 54 U/L   Alkaline Phosphatase 100 38 - 126 U/L   Total Bilirubin 0.3 0.3 - 1.2 mg/dL   GFR calc non Af Amer >60 >60 mL/min   GFR calc Af Amer >60 >60 mL/min   Anion gap 7 5 - 15   Imaging:  Ct Angio Chest Pe W And/or Wo Contrast  Result Date: 06/18/2017 CLINICAL DATA:  Shortness of Breath EXAM: CT ANGIOGRAPHY CHEST WITH CONTRAST TECHNIQUE: Multidetector CT imaging of the chest was performed using the standard protocol during bolus administration of intravenous contrast. Multiplanar CT image reconstructions and MIPs were obtained to evaluate the vascular anatomy. CONTRAST:  100mL ISOVUE-370 IOPAMIDOL (ISOVUE-370) INJECTION 76% COMPARISON:  None. FINDINGS: Cardiovascular: There is no evident pulmonary embolus. There is no appreciable thoracic aortic aneurysm or dissection. Visualized great vessels appear normal. Note that the right and left common carotid arteries arise as a common trunk, an anatomic variant. There is no appreciable pericardial effusion or pericardial thickening. Mediastinum/Nodes: Visualized thyroid appears normal except for a 6 mm nodular opacity arising from the posterior right lobe. There is no appreciable adenopathy in the thoracic region. No esophageal lesions are noted. Lungs/Pleura: Lungs are clear. No pleural effusion or pleural thickening evident. Upper Abdomen: There is hepatic steatosis. Visualized upper abdominal structures otherwise appear unremarkable. Musculoskeletal: There are no blastic or lytic bone lesions. Review of the MIP images confirms the above findings. IMPRESSION: 1.  No demonstrable pulmonary embolus. 2.  Lungs clear. 3. Subcentimeter nodular opacity in the right lobe of the thyroid. Per consensus guidelines, a nodular lesion of this small size  does not warrant additional imaging surveillance. 4.  No evident adenopathy. 5.  Hepatic steatosis. Electronically Signed   By: Bretta BangWilliam  Woodruff III M.D.   On: 06/18/2017 16:10    MAU Course/MDM: Orders Placed This Encounter  Procedures  . CT Angio Chest PE W and/or Wo Contrast  . CBC  . Comprehensive metabolic panel  . Urinalysis, Routine w reflex microscopic  . ED EKG  -CBC showed HCG level of 10.5 increased from 8.8, iron infusion scheduled for 2/21 -CMP showed low CO2, suggestive of mild hyperventilation  -UA negative  -CT normal, no evidence of PE or pneumonia  -EKG showed sinus tachycardia with nonspecific T wave abnormality   Meds ordered this encounter  Medications  . 0.9 %  sodium chloride infusion  . iopamidol (ISOVUE-300) 61 % injection 100 mL  . iopamidol (ISOVUE-370) 76 % injection 100 mL  . ipratropium-albuterol (DUONEB) 0.5-2.5 (3) MG/3ML nebulizer solution 3 mL  . albuterol (PROVENTIL HFA;VENTOLIN HFA) 108 (90 Base) MCG/ACT inhaler    Sig: Inhale 2 puffs into the lungs every 6 (six) hours as needed for wheezing or shortness of breath.    Dispense:  1 Inhaler    Refill:  0    Order Specific Question:   Supervising Provider    Answer:   Jaynie CollinsANYANWU, UGONNA A [[id number]]   NST reviewed- reactive   Treatments in MAU included duoneb nebulizer breathing treatment after negative CT. Pt reports decreased SOB after treatment, able to talk without distress, reports she is "ready to go home and eat". Offered patient inhaler to use PRN at home, patient agrees to POC.   C/w cardiology master for EKG, recommends no follow up for normal EKG in pregnant patient.  Consult Dr Rana SnareLowe with assessment, exam findings and test results of  EKG and CT. Okay to discharge home with follow up in office as scheduled and iron infusion scheduled for 2/21.    Pt discharge with strict preterm labor precautions and reason to return to MAU with worsening symptoms of shortness of breath.   Assessment: 1.  Shortness of breath due to pregnancy in third trimester   2. [redacted] weeks gestation of pregnancy     Plan: Discharge home. Pt stable at discharge.  Preterm Labor precautions and fetal kick counts Rx for albuterol inhaler sent to pharmacy of choice  Follow up as scheduled for prenatal appointments  Follow up on 2/21 for outpatient iron infusion at medical center    Allergies as of 06/18/2017      Reactions   Other Nausea And Vomiting, Other (See Comments)   Iron medication- not sure of name. Arm swelling, hands and feet tingling   Vicodin [hydrocodone-acetaminophen] Nausea Only      Medication List    TAKE these medications   acetaminophen 325 MG tablet Commonly known as:  TYLENOL Take 650 mg by mouth every 6 (six) hours as needed for mild pain or headache.   albuterol 108 (90 Base) MCG/ACT inhaler Commonly known as:  PROVENTIL HFA;VENTOLIN HFA Inhale 2 puffs into the lungs every 6 (six) hours as needed for wheezing or shortness of breath.   DICLEGIS 10-10 MG Tbec Generic drug:  Doxylamine-Pyridoxine Take 10 mg by mouth every 6 (six) hours as needed (nausea).   OVER THE COUNTER MEDICATION Take 1 tablet by mouth daily. Iron supplement- not sure of name or strength   prenatal multivitamin Tabs tablet Take 1 tablet by mouth daily at 12 noon.   progesterone 100 MG capsule Commonly known as:  PROMETRIUM Place 100 mg vaginally at bedtime.   promethazine 25 MG tablet Commonly known as:  PHENERGAN Take 1 tablet (25 mg total) by mouth every 6 (six) hours as needed for nausea or vomiting.   ranitidine 150 MG tablet Commonly known as:  ZANTAC Take 150 mg by mouth daily.       Steward Drone Certified Nurse-Midwife 06/18/2017 8:16 PM

## 2017-06-18 NOTE — MAU Note (Signed)
Pt says she is high risk pregnancy, hgb 8.8 today, says she has low iron and is being followed weekly SOB even without exertion.  Dr Henderson Cloudomblin wants her evaluated and possibly have an x-ray  Feels like her heartrate is fast.   Is scheduled for an iron infusion 2/21

## 2017-06-18 NOTE — Progress Notes (Signed)
IV attempts by Gean MaidensNatalia, CRNA

## 2017-06-19 ENCOUNTER — Other Ambulatory Visit (HOSPITAL_COMMUNITY): Payer: Self-pay | Admitting: *Deleted

## 2017-06-20 ENCOUNTER — Ambulatory Visit (HOSPITAL_COMMUNITY)
Admission: RE | Admit: 2017-06-20 | Discharge: 2017-06-20 | Disposition: A | Discharge status: Home / Self Care | Payer: BLUE CROSS/BLUE SHIELD | Source: Ambulatory Visit | Attending: Obstetrics and Gynecology | Admitting: Obstetrics and Gynecology

## 2017-06-20 NOTE — Progress Notes (Signed)
Pt was a no show for 9:00 Feraheme infusion appt.

## 2017-06-22 ENCOUNTER — Inpatient Hospital Stay (HOSPITAL_COMMUNITY)
Admission: AD | Admit: 2017-06-22 | Discharge: 2017-06-23 | Disposition: A | Discharge status: Home / Self Care | Payer: BLUE CROSS/BLUE SHIELD | Source: Ambulatory Visit | Attending: Obstetrics and Gynecology | Admitting: Obstetrics and Gynecology

## 2017-06-22 ENCOUNTER — Other Ambulatory Visit: Payer: Self-pay

## 2017-06-22 ENCOUNTER — Encounter (HOSPITAL_COMMUNITY): Payer: Self-pay | Admitting: *Deleted

## 2017-06-22 DIAGNOSIS — Z885 Allergy status to narcotic agent status: Secondary | ICD-10-CM | POA: Diagnosis not present

## 2017-06-22 DIAGNOSIS — Z79899 Other long term (current) drug therapy: Secondary | ICD-10-CM | POA: Diagnosis not present

## 2017-06-22 DIAGNOSIS — Z0371 Encounter for suspected problem with amniotic cavity and membrane ruled out: Secondary | ICD-10-CM

## 2017-06-22 DIAGNOSIS — O4703 False labor before 37 completed weeks of gestation, third trimester: Secondary | ICD-10-CM | POA: Diagnosis not present

## 2017-06-22 DIAGNOSIS — Z3A33 33 weeks gestation of pregnancy: Secondary | ICD-10-CM | POA: Insufficient documentation

## 2017-06-22 DIAGNOSIS — O3433 Maternal care for cervical incompetence, third trimester: Secondary | ICD-10-CM

## 2017-06-22 LAB — URINALYSIS, ROUTINE W REFLEX MICROSCOPIC
BILIRUBIN URINE: NEGATIVE
GLUCOSE, UA: NEGATIVE mg/dL
HGB URINE DIPSTICK: NEGATIVE
Ketones, ur: NEGATIVE mg/dL
NITRITE: NEGATIVE
PH: 6 (ref 5.0–8.0)
Protein, ur: NEGATIVE mg/dL
Specific Gravity, Urine: 1.006 (ref 1.005–1.030)

## 2017-06-22 LAB — AMNISURE RUPTURE OF MEMBRANE (ROM) NOT AT ARMC: Amnisure ROM: NEGATIVE

## 2017-06-22 NOTE — MAU Note (Signed)
Pt reports leaking of fluid since yesterday morning. She called the nurse at that time, and she told her to put on a pad and come to MAU if she soaked the pad. Pt did not soak pad, but has been having increase in discharge ever since that time. Pt also had some contractions this evening. She took Procardia at 1745 the contractions stopped, but then around 9pm she had a few more contractions. Pt is concerned about the leaking and wants to make sure that her water isn't broken.

## 2017-06-23 DIAGNOSIS — O4703 False labor before 37 completed weeks of gestation, third trimester: Secondary | ICD-10-CM

## 2017-06-23 DIAGNOSIS — Z3A33 33 weeks gestation of pregnancy: Secondary | ICD-10-CM

## 2017-06-23 DIAGNOSIS — Z885 Allergy status to narcotic agent status: Secondary | ICD-10-CM | POA: Diagnosis not present

## 2017-06-23 DIAGNOSIS — Z79899 Other long term (current) drug therapy: Secondary | ICD-10-CM | POA: Diagnosis not present

## 2017-06-23 NOTE — MAU Provider Note (Signed)
Chief Complaint:  Vaginal Discharge and Contractions   First Provider Initiated Contact with Patient 06/22/17 2323      HPI: Rita Collier is a 32 y.o. G2P0010 at 7223w1d with cerclage in place who presents to maternity admissions reporting leakage of clear fluid yesterday and episode of cramping/contractions today.  She reports that yesterday morning she had wet underwear and called the office. She was told to wear a pad and see if the leakage increased. She did not ever soak a pad so removed the pad and today has continued to have wet underwear but not require a pad.  This afternoon around 2 pm she started having cramping, which worsened by 4 pm so she took her prescribed Procardia. Contractions improved over the next 2 hours and are mild and rare by arrival in MAU. There are no other associated symptoms and she has not tried any treatments. Because of her cerclage, she wanted to get checked out since the contractions were so uncomfortable earlier today.  She reports good fetal movement.  HPI  Past Medical History: Past Medical History:  Diagnosis Date  . GERD (gastroesophageal reflux disease)   . Hx of kidney removal 12/2014    Past obstetric history: OB History  Gravida Para Term Preterm AB Living  2       1    SAB TAB Ectopic Multiple Live Births    1          # Outcome Date GA Lbr Len/2nd Weight Sex Delivery Anes PTL Lv  2 Current           1 TAB               Past Surgical History: Past Surgical History:  Procedure Laterality Date  . APPENDECTOMY  2007  . CERVICAL CERCLAGE    . KIDNEY DONATION  2016    Family History: Family History  Problem Relation Age of Onset  . Stomach cancer Maternal Aunt   . Pancreatic cancer Maternal Uncle     Social History: Social History   Tobacco Use  . Smoking status: Never Smoker  . Smokeless tobacco: Never Used  Substance Use Topics  . Alcohol use: Yes    Comment: occ when not pregnant  . Drug use: No    Allergies:   Allergies  Allergen Reactions  . Other Nausea And Vomiting and Other (See Comments)    Iron medication- not sure of name. Arm swelling, hands and feet tingling  . Vicodin [Hydrocodone-Acetaminophen] Nausea Only    Meds:  Medications Prior to Admission  Medication Sig Dispense Refill Last Dose  . acetaminophen (TYLENOL) 325 MG tablet Take 650 mg by mouth every 6 (six) hours as needed for mild pain or headache.   Past Week at Unknown time  . albuterol (PROVENTIL HFA;VENTOLIN HFA) 108 (90 Base) MCG/ACT inhaler Inhale 2 puffs into the lungs every 6 (six) hours as needed for wheezing or shortness of breath. 1 Inhaler 0 06/21/2017 at Unknown time  . DICLEGIS 10-10 MG TBEC Take 10 mg by mouth every 6 (six) hours as needed (nausea).   0 Past Month at Unknown time  . Prenatal Vit-Fe Fumarate-FA (PRENATAL MULTIVITAMIN) TABS tablet Take 1 tablet by mouth daily at 12 noon.   06/21/2017 at Unknown time  . progesterone (PROMETRIUM) 100 MG capsule Place 100 mg vaginally at bedtime.   2 Past Week at Unknown time  . ranitidine (ZANTAC) 150 MG tablet Take 150 mg by mouth daily.   Past Month  at Unknown time  . OVER THE COUNTER MEDICATION Take 1 tablet by mouth daily. Iron supplement- not sure of name or strength   Past Week at Unknown time  . promethazine (PHENERGAN) 25 MG tablet Take 1 tablet (25 mg total) by mouth every 6 (six) hours as needed for nausea or vomiting. (Patient not taking: Reported on 06/18/2017) 20 tablet 0 Not Taking at Unknown time    ROS:  Review of Systems  Constitutional: Negative for chills, fatigue and fever.  Eyes: Negative for visual disturbance.  Respiratory: Negative for shortness of breath.   Cardiovascular: Negative for chest pain.  Gastrointestinal: Positive for abdominal pain. Negative for nausea and vomiting.  Genitourinary: Positive for pelvic pain and vaginal discharge. Negative for difficulty urinating, dysuria, flank pain, vaginal bleeding and vaginal pain.   Neurological: Negative for dizziness and headaches.  Psychiatric/Behavioral: Negative.      I have reviewed patient's Past Medical Hx, Surgical Hx, Family Hx, Social Hx, medications and allergies.   Physical Exam   Patient Vitals for the past 24 hrs:  Height Weight  06/22/17 2237 5\' 5"  (1.651 m) 171 lb (77.6 kg)   Constitutional: Well-developed, well-nourished female in no acute distress.  Cardiovascular: normal rate Respiratory: normal effort GI: Abd soft, non-tender, gravid appropriate for gestational age.  MS: Extremities nontender, no edema, normal ROM Neurologic: Alert and oriented x 4.  GU: Neg CVAT.  PELVIC EXAM: Cervix pink, visually closed, without lesion, cerclage suture in place, no tension visible, moderate amount white creamy discharge, vaginal walls and external genitalia normal   Dilation: Closed Effacement (%): Thick Cervical Position: Posterior Exam by:: Leftwich-kirby CNM  Cerclage suture palpable without tension  FHT:  Baseline 135 , moderate variability, accelerations present, no decelerations Contractions: q 20 mins, mild to palpation   Labs: Results for orders placed or performed during the hospital encounter of 06/22/17 (from the past 24 hour(s))  Urinalysis, Routine w reflex microscopic     Status: Abnormal   Collection Time: 06/22/17 10:35 PM  Result Value Ref Range   Color, Urine STRAW (A) YELLOW   APPearance CLEAR CLEAR   Specific Gravity, Urine 1.006 1.005 - 1.030   pH 6.0 5.0 - 8.0   Glucose, UA NEGATIVE NEGATIVE mg/dL   Hgb urine dipstick NEGATIVE NEGATIVE   Bilirubin Urine NEGATIVE NEGATIVE   Ketones, ur NEGATIVE NEGATIVE mg/dL   Protein, ur NEGATIVE NEGATIVE mg/dL   Nitrite NEGATIVE NEGATIVE   Leukocytes, UA TRACE (A) NEGATIVE   RBC / HPF 0-5 0 - 5 RBC/hpf   WBC, UA 0-5 0 - 5 WBC/hpf   Bacteria, UA RARE (A) NONE SEEN   Squamous Epithelial / LPF 0-5 (A) NONE SEEN   Mucus PRESENT   Amnisure rupture of membrane (rom)not at [facility]      Status: None   Collection Time: 06/22/17 11:28 PM  Result Value Ref Range   Amnisure ROM NEGATIVE       Imaging:    MAU Course/MDM: UA wnl, amnisure negative NST reviewed and reactive No evidence of PPROM or preterm labor, cerclage in place without tension Consult Dr Vincente Poli with presentation, exam findings and test results.  F/U in office as scheduled, return to MAU as needed for signs of labor or emergencies Pt discharge with strict preterm labor precautions.    Assessment: 1. Threatened preterm labor, third trimester   2. Encounter for suspected premature rupture of amniotic membranes, with rupture of membranes not found   3. Cervical cerclage suture present, third trimester  Plan: Discharge home Labor precautions and fetal kick counts Follow-up Information    [facility] Follow up.   Why:  As scheduled, return to MAU as needed for signs of labor or emergencies Contact information: [address] [place] [address] [phone]          Allergies as of 06/23/2017      Reactions   Other Nausea And Vomiting, Other (See Comments)   Iron medication- not sure of name. Arm swelling, hands and feet tingling   Vicodin [hydrocodone-acetaminophen] Nausea Only      Medication List    TAKE these medications   acetaminophen 325 MG tablet Commonly known as:  TYLENOL Take 650 mg by mouth every 6 (six) hours as needed for mild pain or headache.   albuterol 108 (90 Base) MCG/ACT inhaler Commonly known as:  PROVENTIL HFA;VENTOLIN HFA Inhale 2 puffs into the lungs every 6 (six) hours as needed for wheezing or shortness of breath.   DICLEGIS 10-10 MG Tbec Generic drug:  Doxylamine-Pyridoxine Take 10 mg by mouth every 6 (six) hours as needed (nausea).   OVER THE COUNTER MEDICATION Take 1 tablet by mouth daily. Iron supplement- not sure of name or strength   prenatal multivitamin Tabs tablet Take 1 tablet by mouth daily at 12  noon.   progesterone 100 MG capsule Commonly known as:  PROMETRIUM Place 100 mg vaginally at bedtime.   promethazine 25 MG tablet Commonly known as:  PHENERGAN Take 1 tablet (25 mg total) by mouth every 6 (six) hours as needed for nausea or vomiting.   ranitidine 150 MG tablet Commonly known as:  ZANTAC Take 150 mg by mouth daily.       Sharen Counter Certified Nurse-Midwife 06/23/2017 12:26 AM

## 2017-06-24 ENCOUNTER — Ambulatory Visit (HOSPITAL_COMMUNITY)
Admission: RE | Admit: 2017-06-24 | Discharge: 2017-06-24 | Disposition: A | Discharge status: Home / Self Care | Payer: BLUE CROSS/BLUE SHIELD | Source: Ambulatory Visit | Attending: Obstetrics and Gynecology | Admitting: Obstetrics and Gynecology

## 2017-06-24 DIAGNOSIS — D649 Anemia, unspecified: Secondary | ICD-10-CM | POA: Diagnosis not present

## 2017-06-24 DIAGNOSIS — O99012 Anemia complicating pregnancy, second trimester: Secondary | ICD-10-CM | POA: Insufficient documentation

## 2017-06-24 MED ORDER — SODIUM CHLORIDE 0.9 % IV SOLN
510.0000 mg | INTRAVENOUS | Status: DC
  Administered 2017-06-24: 510 mg via INTRAVENOUS
  Filled 2017-06-24: qty 17

## 2017-06-24 NOTE — Discharge Instructions (Signed)

## 2017-06-25 DIAGNOSIS — D649 Anemia, unspecified: Secondary | ICD-10-CM | POA: Diagnosis not present

## 2017-06-27 ENCOUNTER — Ambulatory Visit (HOSPITAL_COMMUNITY)
Admission: RE | Admit: 2017-06-27 | Discharge: 2017-06-27 | Disposition: A | Discharge status: Home / Self Care | Payer: BLUE CROSS/BLUE SHIELD | Source: Ambulatory Visit | Attending: Obstetrics and Gynecology | Admitting: Obstetrics and Gynecology

## 2017-06-27 DIAGNOSIS — O99012 Anemia complicating pregnancy, second trimester: Secondary | ICD-10-CM | POA: Insufficient documentation

## 2017-06-27 MED ORDER — SODIUM CHLORIDE 0.9 % IV SOLN
510.0000 mg | INTRAVENOUS | Status: AC
  Administered 2017-06-27: 510 mg via INTRAVENOUS
  Filled 2017-06-27: qty 17

## 2017-07-05 DIAGNOSIS — D649 Anemia, unspecified: Secondary | ICD-10-CM | POA: Diagnosis not present

## 2017-07-12 DIAGNOSIS — D649 Anemia, unspecified: Secondary | ICD-10-CM | POA: Diagnosis not present

## 2017-07-12 DIAGNOSIS — Z3685 Encounter for antenatal screening for Streptococcus B: Secondary | ICD-10-CM | POA: Diagnosis not present

## 2017-07-12 DIAGNOSIS — Z3480 Encounter for supervision of other normal pregnancy, unspecified trimester: Secondary | ICD-10-CM | POA: Diagnosis not present

## 2017-07-14 ENCOUNTER — Encounter (HOSPITAL_COMMUNITY): Payer: Self-pay

## 2017-07-14 ENCOUNTER — Other Ambulatory Visit: Payer: Self-pay

## 2017-07-14 ENCOUNTER — Inpatient Hospital Stay (HOSPITAL_COMMUNITY)
Admission: AD | Admit: 2017-07-14 | Discharge: 2017-07-14 | Disposition: A | Discharge status: Home / Self Care | Payer: BLUE CROSS/BLUE SHIELD | Source: Ambulatory Visit | Attending: Obstetrics and Gynecology | Admitting: Obstetrics and Gynecology

## 2017-07-14 DIAGNOSIS — N858 Other specified noninflammatory disorders of uterus: Secondary | ICD-10-CM

## 2017-07-14 DIAGNOSIS — Z3490 Encounter for supervision of normal pregnancy, unspecified, unspecified trimester: Secondary | ICD-10-CM | POA: Diagnosis not present

## 2017-07-14 DIAGNOSIS — Z3A00 Weeks of gestation of pregnancy not specified: Secondary | ICD-10-CM | POA: Insufficient documentation

## 2017-07-14 HISTORY — DX: Anemia, unspecified: D64.9

## 2017-07-14 NOTE — Progress Notes (Signed)
G2P0 @ 36.[redacted] wksga. Here d/t ctx since lastnight. Denies LOF or bleeding. +FM. EFM applied. VSS see flow sheet for details  SVE 3/40/-3  1055: provider notified. Report status of pt given. Ordered to recheck cervix in an hr and pt may walk.   Sent pt walking and instructed to return in at 1200.

## 2017-07-14 NOTE — Discharge Instructions (Signed)

## 2017-07-14 NOTE — MAU Note (Signed)
Pt presents with c/o ctxs since 2300 last night.  Reports ctxs are every 10 minutes.  Denies VB or LOF.  Reports +FM.

## 2017-07-18 LAB — OB RESULTS CONSOLE GBS: GBS: NEGATIVE

## 2017-07-31 ENCOUNTER — Encounter (HOSPITAL_COMMUNITY): Payer: Self-pay | Admitting: *Deleted

## 2017-07-31 ENCOUNTER — Telehealth (HOSPITAL_COMMUNITY): Payer: Self-pay | Admitting: *Deleted

## 2017-07-31 NOTE — Telephone Encounter (Signed)
Preadmission screen  

## 2017-08-06 ENCOUNTER — Encounter (HOSPITAL_COMMUNITY): Payer: Self-pay

## 2017-08-06 ENCOUNTER — Inpatient Hospital Stay (HOSPITAL_COMMUNITY): Payer: BLUE CROSS/BLUE SHIELD | Admitting: Anesthesiology

## 2017-08-06 ENCOUNTER — Inpatient Hospital Stay (HOSPITAL_COMMUNITY)
Admission: RE | Admit: 2017-08-06 | Discharge: 2017-08-08 | DRG: 807 | Disposition: A | Discharge status: Home / Self Care | Payer: BLUE CROSS/BLUE SHIELD | Source: Ambulatory Visit | Attending: Obstetrics & Gynecology | Admitting: Obstetrics & Gynecology

## 2017-08-06 DIAGNOSIS — Z8489 Family history of other specified conditions: Secondary | ICD-10-CM | POA: Diagnosis not present

## 2017-08-06 DIAGNOSIS — Z832 Family history of diseases of the blood and blood-forming organs and certain disorders involving the immune mechanism: Secondary | ICD-10-CM | POA: Diagnosis not present

## 2017-08-06 DIAGNOSIS — O26893 Other specified pregnancy related conditions, third trimester: Secondary | ICD-10-CM | POA: Diagnosis not present

## 2017-08-06 DIAGNOSIS — Q828 Other specified congenital malformations of skin: Secondary | ICD-10-CM | POA: Diagnosis not present

## 2017-08-06 DIAGNOSIS — Z905 Acquired absence of kidney: Secondary | ICD-10-CM

## 2017-08-06 DIAGNOSIS — Z3A39 39 weeks gestation of pregnancy: Secondary | ICD-10-CM

## 2017-08-06 DIAGNOSIS — Z3490 Encounter for supervision of normal pregnancy, unspecified, unspecified trimester: Secondary | ICD-10-CM

## 2017-08-06 DIAGNOSIS — Z2882 Immunization not carried out because of caregiver refusal: Secondary | ICD-10-CM | POA: Diagnosis not present

## 2017-08-06 LAB — CBC
HCT: 39.5 % (ref 36.0–46.0)
HEMOGLOBIN: 13.1 g/dL (ref 12.0–15.0)
MCH: 28.3 pg (ref 26.0–34.0)
MCHC: 33.2 g/dL (ref 30.0–36.0)
MCV: 85.3 fL (ref 78.0–100.0)
Platelets: 252 10*3/uL (ref 150–400)
RBC: 4.63 MIL/uL (ref 3.87–5.11)
RDW: 19.7 % — ABNORMAL HIGH (ref 11.5–15.5)
WBC: 8.5 10*3/uL (ref 4.0–10.5)

## 2017-08-06 LAB — TYPE AND SCREEN
ABO/RH(D): B POS
Antibody Screen: NEGATIVE

## 2017-08-06 LAB — ABO/RH: ABO/RH(D): B POS

## 2017-08-06 MED ORDER — WITCH HAZEL-GLYCERIN EX PADS
1.0000 "application " | MEDICATED_PAD | CUTANEOUS | Status: DC | PRN
  Administered 2017-08-07: 1 via TOPICAL

## 2017-08-06 MED ORDER — FLEET ENEMA 7-19 GM/118ML RE ENEM: 1.0000 | ENEMA | RECTAL | Status: DC | PRN

## 2017-08-06 MED ORDER — EPHEDRINE 5 MG/ML INJ
10.0000 mg | INTRAVENOUS | Status: DC | PRN
  Filled 2017-08-06: qty 2

## 2017-08-06 MED ORDER — TERBUTALINE SULFATE 1 MG/ML IJ SOLN
0.2500 mg | Freq: Once | INTRAMUSCULAR | Status: DC | PRN
  Filled 2017-08-06: qty 1

## 2017-08-06 MED ORDER — VITAMIN K1 1 MG/0.5ML IJ SOLN
INTRAMUSCULAR | Status: AC
  Filled 2017-08-06: qty 0.5

## 2017-08-06 MED ORDER — FENTANYL CITRATE (PF) 100 MCG/2ML IJ SOLN
50.0000 ug | INTRAMUSCULAR | Status: DC | PRN
Start: 2017-08-06 — End: 2017-08-06
  Administered 2017-08-06: 50 ug via INTRAVENOUS
  Filled 2017-08-06: qty 2

## 2017-08-06 MED ORDER — ONDANSETRON HCL 4 MG/2ML IJ SOLN: 4.0000 mg | INTRAMUSCULAR | Status: DC | PRN

## 2017-08-06 MED ORDER — OXYTOCIN BOLUS FROM INFUSION
500.0000 mL | Freq: Once | INTRAVENOUS | Status: AC
  Administered 2017-08-06: 500 mL via INTRAVENOUS

## 2017-08-06 MED ORDER — DIPHENHYDRAMINE HCL 25 MG PO CAPS
25.0000 mg | ORAL_CAPSULE | Freq: Four times a day (QID) | ORAL | Status: DC | PRN
Start: 2017-08-06 — End: 2017-08-08

## 2017-08-06 MED ORDER — LACTATED RINGERS IV SOLN: 500.0000 mL | Freq: Once | INTRAVENOUS | Status: DC

## 2017-08-06 MED ORDER — SENNOSIDES-DOCUSATE SODIUM 8.6-50 MG PO TABS
2.0000 | ORAL_TABLET | ORAL | Status: DC
  Administered 2017-08-07 – 2017-08-08 (×2): 2 via ORAL
  Filled 2017-08-06 (×2): qty 2

## 2017-08-06 MED ORDER — PHENYLEPHRINE 40 MCG/ML (10ML) SYRINGE FOR IV PUSH (FOR BLOOD PRESSURE SUPPORT)
80.0000 ug | PREFILLED_SYRINGE | INTRAVENOUS | Status: DC | PRN
  Filled 2017-08-06: qty 5

## 2017-08-06 MED ORDER — FENTANYL 2.5 MCG/ML BUPIVACAINE 1/10 % EPIDURAL INFUSION (WH - ANES)
14.0000 mL/h | INTRAMUSCULAR | Status: DC | PRN
  Administered 2017-08-06: 14 mL/h via EPIDURAL
  Filled 2017-08-06: qty 100

## 2017-08-06 MED ORDER — ZOLPIDEM TARTRATE 5 MG PO TABS: 5.0000 mg | ORAL_TABLET | Freq: Every evening | ORAL | Status: DC | PRN

## 2017-08-06 MED ORDER — LACTATED RINGERS IV SOLN
INTRAVENOUS | Status: DC
  Administered 2017-08-06: 13:00:00 via INTRAVENOUS

## 2017-08-06 MED ORDER — OXYCODONE-ACETAMINOPHEN 5-325 MG PO TABS: 1.0000 | ORAL_TABLET | ORAL | Status: DC | PRN

## 2017-08-06 MED ORDER — ONDANSETRON HCL 4 MG PO TABS: 4.0000 mg | ORAL_TABLET | ORAL | Status: DC | PRN

## 2017-08-06 MED ORDER — COCONUT OIL OIL: 1.0000 "application " | TOPICAL_OIL | Status: DC | PRN

## 2017-08-06 MED ORDER — SIMETHICONE 80 MG PO CHEW
80.0000 mg | CHEWABLE_TABLET | ORAL | Status: DC | PRN
Start: 2017-08-06 — End: 2017-08-08

## 2017-08-06 MED ORDER — SOD CITRATE-CITRIC ACID 500-334 MG/5ML PO SOLN: 30.0000 mL | ORAL | Status: DC | PRN

## 2017-08-06 MED ORDER — ONDANSETRON HCL 4 MG/2ML IJ SOLN: 4.0000 mg | Freq: Four times a day (QID) | INTRAMUSCULAR | Status: DC | PRN

## 2017-08-06 MED ORDER — DIBUCAINE 1 % RE OINT
1.0000 "application " | TOPICAL_OINTMENT | RECTAL | Status: DC | PRN
  Administered 2017-08-07: 1 via RECTAL
  Filled 2017-08-06: qty 28

## 2017-08-06 MED ORDER — TETANUS-DIPHTH-ACELL PERTUSSIS 5-2.5-18.5 LF-MCG/0.5 IM SUSP: 0.5000 mL | Freq: Once | INTRAMUSCULAR | Status: DC

## 2017-08-06 MED ORDER — LACTATED RINGERS IV SOLN: 500.0000 mL | INTRAVENOUS | Status: DC | PRN

## 2017-08-06 MED ORDER — LIDOCAINE HCL (PF) 1 % IJ SOLN
30.0000 mL | INTRAMUSCULAR | Status: DC | PRN
  Filled 2017-08-06: qty 30

## 2017-08-06 MED ORDER — BENZOCAINE-MENTHOL 20-0.5 % EX AERO
1.0000 "application " | INHALATION_SPRAY | CUTANEOUS | Status: DC | PRN
  Administered 2017-08-08: 1 via TOPICAL
  Filled 2017-08-06 (×2): qty 56

## 2017-08-06 MED ORDER — ACETAMINOPHEN 325 MG PO TABS
650.0000 mg | ORAL_TABLET | ORAL | Status: DC | PRN
  Administered 2017-08-06 – 2017-08-08 (×7): 650 mg via ORAL
  Filled 2017-08-06 (×7): qty 2

## 2017-08-06 MED ORDER — IBUPROFEN 600 MG PO TABS
600.0000 mg | ORAL_TABLET | Freq: Four times a day (QID) | ORAL | Status: DC
Start: 2017-08-06 — End: 2017-08-08
  Administered 2017-08-06 – 2017-08-08 (×7): 600 mg via ORAL
  Filled 2017-08-06 (×7): qty 1

## 2017-08-06 MED ORDER — PRENATAL MULTIVITAMIN CH
1.0000 | ORAL_TABLET | Freq: Every day | ORAL | Status: DC
  Administered 2017-08-07 – 2017-08-08 (×2): 1 via ORAL
  Filled 2017-08-06 (×2): qty 1

## 2017-08-06 MED ORDER — TRAMADOL HCL 50 MG PO TABS: 50.0000 mg | ORAL_TABLET | Freq: Four times a day (QID) | ORAL | Status: DC | PRN

## 2017-08-06 MED ORDER — DIPHENHYDRAMINE HCL 50 MG/ML IJ SOLN: 12.5000 mg | INTRAMUSCULAR | Status: DC | PRN

## 2017-08-06 MED ORDER — PHENYLEPHRINE 40 MCG/ML (10ML) SYRINGE FOR IV PUSH (FOR BLOOD PRESSURE SUPPORT)
80.0000 ug | PREFILLED_SYRINGE | INTRAVENOUS | Status: DC | PRN
  Filled 2017-08-06: qty 5
  Filled 2017-08-06: qty 10

## 2017-08-06 MED ORDER — OXYCODONE-ACETAMINOPHEN 5-325 MG PO TABS: 2.0000 | ORAL_TABLET | ORAL | Status: DC | PRN

## 2017-08-06 MED ORDER — LIDOCAINE HCL (PF) 1 % IJ SOLN
INTRAMUSCULAR | Status: DC | PRN
  Administered 2017-08-06: 6 mL
  Administered 2017-08-06: 4 mL

## 2017-08-06 MED ORDER — OXYTOCIN 40 UNITS IN LACTATED RINGERS INFUSION - SIMPLE MED
1.0000 m[IU]/min | INTRAVENOUS | Status: DC
  Administered 2017-08-06: 4 m[IU]/min via INTRAVENOUS
  Administered 2017-08-06: 2 m[IU]/min via INTRAVENOUS
  Filled 2017-08-06: qty 1000

## 2017-08-06 MED ORDER — ALBUTEROL SULFATE (2.5 MG/3ML) 0.083% IN NEBU: 3.0000 mL | INHALATION_SOLUTION | Freq: Four times a day (QID) | RESPIRATORY_TRACT | Status: DC | PRN

## 2017-08-06 MED ORDER — OXYTOCIN 40 UNITS IN LACTATED RINGERS INFUSION - SIMPLE MED: 2.5000 [IU]/h | INTRAVENOUS | Status: DC

## 2017-08-06 NOTE — Progress Notes (Signed)
CVX 4/90/-1, AROM, clear.  Mitchel HonourMegan Alberto Schoch, DO

## 2017-08-06 NOTE — Anesthesia Pain Management Evaluation Note (Signed)
  CRNA Pain Management Visit Note  Patient: Rita Collier, 32 y.o., female  "Hello I am a [facility] of the anesthesia [facility] at [facility]. We have an anesthesia team available at all times to provide care throughout the hospital, including epidural management and anesthesia for C-section. I don't know your plan for the delivery whether it a natural birth, water birth, IV sedation, nitrous supplementation, doula or epidural, but we want to meet your pain goals."   1.Was your pain managed to your expectations on prior hospitalizations?   Yes   2.What is your expectation for pain management during this hospitalization?     IV pain meds  3.How can we help you reach that goal? support  Record the patient's initial score and the patient's pain goal.   Pain: 4  Pain Goal: 9 [facility] wants you to be able to say your pain was always managed very well.  Trellis PaganiniBREWER,[place] Gamero N 08/06/2017

## 2017-08-06 NOTE — Anesthesia Preprocedure Evaluation (Signed)
Anesthesia Evaluation  Patient identified by MRN, date of birth, ID band Patient awake    Reviewed: Allergy & Precautions, H&P , Patient's Chart, lab work & pertinent test results, reviewed documented beta blocker date and time   Airway Mallampati: II  TM Distance: >3 FB Neck ROM: full    Dental no notable dental hx.    Pulmonary    Pulmonary exam normal breath sounds clear to auscultation       Cardiovascular  Rhythm:regular Rate:Normal     Neuro/Psych    GI/Hepatic   Endo/Other    Renal/GU      Musculoskeletal   Abdominal   Peds  Hematology   Anesthesia Other Findings   Reproductive/Obstetrics                             Anesthesia Physical Anesthesia Plan  ASA: II  Anesthesia Plan: Epidural   Post-op Pain Management:    Induction:   PONV Risk Score and Plan:   Airway Management Planned:   Additional Equipment:   Intra-op Plan:   Post-operative Plan:   Informed Consent: I have reviewed the patients History and Physical, chart, labs and discussed the procedure including the risks, benefits and alternatives for the proposed anesthesia with the patient or authorized representative who has indicated his/her understanding and acceptance.   Dental Advisory Given  Plan Discussed with: CRNA and Surgeon  Anesthesia Plan Comments: (Labs checked- platelets confirmed with RN in room. Fetal heart tracing, per RN, reported to be stable enough for sitting procedure. Discussed epidural, and patient consents to the procedure:  included risk of possible headache,backache, failed block, allergic reaction, and nerve injury. This patient was asked if she had any questions or concerns before the procedure started.)        Anesthesia Quick Evaluation  

## 2017-08-06 NOTE — Progress Notes (Signed)
Patient requested to eat yogurt parfait before starting the induction process. Dr. Langston MaskerMorris stated that it was okay to let patient eat before starting process.

## 2017-08-06 NOTE — Anesthesia Procedure Notes (Signed)
Epidural Patient location during procedure: OB  Staffing Anesthesiologist: Marguerita Stapp, MD  Preanesthetic Checklist Completed: patient identified, pre-op evaluation, timeout performed, IV checked, risks and benefits discussed and monitors and equipment checked  Epidural Patient position: sitting Prep: DuraPrep Patient monitoring: blood pressure and continuous pulse ox Approach: right paramedian Location: L3-L4 Injection technique: LOR air  Needle:  Needle type: Tuohy  Needle gauge: 17 G Needle insertion depth: 6 cm Catheter type: closed end flexible Catheter size: 19 Gauge Catheter at skin depth: 12 cm Test dose: negative  Assessment Sensory level: T8  Additional Notes   Dosing of Epidural:  1st dose, through catheter .............................................  Xylocaine 40 mg  2nd dose, through catheter, after waiting 3 minutes.........Xylocaine 60 mg    As each dose occurred, patient was free of IV sx; and patient exhibited no evidence of SA injection.  Patient is more comfortable after epidural dosed. Please see RN's note for documentation of vital signs,and FHR which are stable.  Patient reminded not to try to ambulate with numb legs, and that an RN must be present when she attempts to get up.          

## 2017-08-06 NOTE — H&P (Signed)
Rita Collier is a 32 y.o. female presenting for elective IOL.  The patient had ultrasound indicated cerclage this pregnancy with removal at 37 weeks.  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  GBS negative.   OB History    Gravida  2   Para  0   Term      Preterm      AB  1   Living        SAB      TAB  1   Ectopic      Multiple      Live Births             Past Medical History:  Diagnosis Date  . Anemia    iron infusion   . GERD (gastroesophageal reflux disease)   . Headache   . Hx of kidney removal 12/2014  . Spinal headache    blood patch after cerclage   Past Surgical History:  Procedure Laterality Date  . APPENDECTOMY  2007  . CERVICAL CERCLAGE    . INDUCED ABORTION    . KIDNEY DONATION  2016   Family History: family history includes Alcohol abuse in her brother, father, mother, and sister; Diabetes in her maternal aunt and maternal uncle; Hypertension in her maternal aunt and maternal uncle; Pancreatic cancer in her maternal uncle; Stomach cancer in her maternal aunt. Social History:  reports that she has never smoked. She has never used smokeless tobacco. She reports that she drinks alcohol. She reports that she does not use drugs.     Maternal Diabetes: No Genetic Screening: Normal Maternal Ultrasounds/Referrals: Normal Fetal Ultrasounds or other Referrals:  None Maternal Substance Abuse:  No Significant Maternal Medications:  None Significant Maternal Lab Results:  Lab values include: Group B Strep negative Other Comments:  None  ROS Maternal Medical History:  Contractions: Frequency: rare.   Perceived severity is mild.    Fetal activity: Perceived fetal activity is normal.   Last perceived fetal movement was within the past hour.    Prenatal complications: no prenatal complications Prenatal Complications - Diabetes: none.    Dilation: 4.5 Effacement (%): 80 Station: -2, -3 Exam  by:: Gifford ShaveYancey Luft RN  Blood pressure 101/70, pulse 99, temperature 98.6 F (37 C), temperature source Oral, resp. rate 18, height 5\' 5"  (1.651 m), weight 181 lb 12.8 oz (82.5 kg), last menstrual period 11/03/2016, SpO2 100 %. Maternal Exam:  Uterine Assessment: Contraction strength is mild.  Contraction frequency is rare.   Abdomen: Patient reports no abdominal tenderness. Fundal height is c/w dates.   Estimated fetal weight is 7#8.       Physical Exam  Constitutional: She is oriented to person, place, and time. She appears well-developed and well-nourished.  GI: Soft. There is no rebound and no guarding.  Neurological: She is alert and oriented to person, place, and time.  Skin: Skin is warm and dry.  Psychiatric: She has a normal mood and affect. Her behavior is normal.    Prenatal labs: ABO, Rh: B/Positive/-- (10/04 0000) Antibody: Negative (10/04 0000) Rubella: Immune (10/04 0000) RPR: Nonreactive (10/04 0000)  HBsAg: Negative (10/04 0000)  HIV: Non-reactive (10/04 0000)  GBS:     Assessment/Plan: 31yo G2P0010 at 39 weeks for elective IOL -Pitocin -AROM when in good CTX pattern -Epidural when desired -Anticipate NSVD   Darol Cush 08/06/2017, 9:14 AM

## 2017-08-07 LAB — CBC
HCT: 31.5 % — ABNORMAL LOW (ref 36.0–46.0)
Hemoglobin: 10.2 g/dL — ABNORMAL LOW (ref 12.0–15.0)
MCH: 27.5 pg (ref 26.0–34.0)
MCHC: 32.4 g/dL (ref 30.0–36.0)
MCV: 84.9 fL (ref 78.0–100.0)
PLATELETS: 230 10*3/uL (ref 150–400)
RBC: 3.71 MIL/uL — AB (ref 3.87–5.11)
RDW: 19.6 % — ABNORMAL HIGH (ref 11.5–15.5)
WBC: 11.8 10*3/uL — AB (ref 4.0–10.5)

## 2017-08-07 LAB — RPR: RPR: NONREACTIVE

## 2017-08-07 NOTE — Progress Notes (Signed)
Post Partum Day 1 Subjective: no complaints  Objective: Blood pressure 100/68, pulse 79, temperature 98.2 F (36.8 C), temperature source Oral, resp. rate 18, height 5\' 5"  (1.651 m), weight 181 lb 12.8 oz (82.5 kg), last menstrual period 11/03/2016, SpO2 100 %, unknown if currently breastfeeding.  Physical Exam:  General: alert and cooperative Lochia: appropriate Uterine Fundus: firm Incision: healing well DVT Evaluation: No evidence of DVT seen on physical exam.  Recent Labs    08/06/17 0813 08/07/17 0520  HGB 13.1 10.2*  HCT 39.5 31.5*    Assessment/Plan: Plan for discharge tomorrow  Declines circ - plans to do as outpatient   LOS: 1 day   Rita Collier 08/07/2017, 8:42 AM

## 2017-08-07 NOTE — Anesthesia Postprocedure Evaluation (Signed)
Anesthesia Post Note  Patient: Rita Collier  Procedure(s) Performed: AN AD HOC LABOR EPIDURAL     Patient location during evaluation: Mother Baby Anesthesia Type: Epidural Level of consciousness: awake and alert and oriented Pain management: satisfactory to patient Vital Signs Assessment: post-procedure vital signs reviewed and stable Respiratory status: respiratory function stable Cardiovascular status: stable Postop Assessment: no headache, no backache, epidural receding, patient able to bend at knees, no signs of nausea or vomiting and adequate PO intake Anesthetic complications: no    Last Vitals:  Vitals:   08/07/17 0401 08/07/17 0526  BP: 92/73 100/68  Pulse: 90 79  Resp: 18 18  Temp: 37 C 36.8 C  SpO2:      Last Pain:  Vitals:   08/07/17 0700  TempSrc:   PainSc: Asleep   Pain Goal: Patients Stated Pain Goal: 0 (08/07/17 0359)               Karleen DolphinFUSSELL,Cylee Dattilo

## 2017-08-07 NOTE — Lactation Note (Signed)
This note was copied from a baby's chart. Lactation Consultation Note  P1, Baby sleeping. Visitors in room. Offered to help w/ breastfeeding and hand expression. Mother states baby is doing fine and she knows how to hand express. Mom encouraged to feed baby 8-12 times/24 hours and with feeding cues.  Discussed cluster feeding and bf on both breasts.  Suggest mother call if she needs further assistance.  Patient Name: Rita Collier Rita Collier     Maternal Data    Feeding Feeding Type: Breast Fed Length of feed: 20 min  LATCH Score Latch: Grasps breast easily, tongue down, lips flanged, rhythmical sucking.  Audible Swallowing: A few with stimulation  Type of Nipple: Everted at rest and after stimulation  Comfort (Breast/Nipple): Soft / non-tender  Hold (Positioning): No assistance needed to correctly position infant at breast.  LATCH Score: 9  Interventions    Lactation Tools Discussed/Used     Consult Status      Dahlia ByesBerkelhammer, [facility], LLCBoschen Collier, 5:51 PM

## 2017-08-08 NOTE — Discharge Summary (Signed)
Obstetric Discharge Summary Reason for Admission: induction of labor Prenatal Procedures: cerclage Intrapartum Procedures: spontaneous vaginal delivery Postpartum Procedures: none Complications-Operative and Postpartum: none Hemoglobin  Date Value Ref Range Status  08/07/2017 10.2 (L) 12.0 - 15.0 g/dL Final    Comment:    REPEATED TO VERIFY DELTA CHECK NOTED    HCT  Date Value Ref Range Status  08/07/2017 31.5 (L) 36.0 - 46.0 % Final    Physical Exam:  General: alert Lochia: appropriate Uterine Fundus: firm Incision: healing well DVT Evaluation: No evidence of DVT seen on physical exam.  Discharge Diagnoses: Term Pregnancy-delivered  Discharge Information: Date: 08/08/2017 Activity: pelvic rest Diet: routine Medications: PNV Condition: stable Instructions: refer to practice specific booklet Discharge to: home Follow-up Information    [facility] Schedule an appointment as soon as possible for a visit in 6 week(s).   Contact information: [address] [place] [id number] [phone]           Newborn Data: Live born female  Birth Weight: 7 lb 10.4 oz (3470 g) APGAR: 9, 9  Newborn Delivery   Birth date/time:  08/06/2017 14:13:00 Delivery type:  Vaginal, Spontaneous     Home with mother.  Rita Collier Rita Collier 08/08/2017, 8:36 AM

## 2017-08-08 NOTE — Lactation Note (Signed)
This note was copied from a baby's chart. Lactation Consultation Note  Patient Name: Rita Lawernce Keasrica Rather YQIHK'VToday's Date: 08/08/2017  Mom states feedings are going well.  No questions or concerns this AM.  Discharge instructions given including engorgement treatment.  Lactation outpatient services and support reviewed and encouraged prn.   Maternal Data    Feeding Feeding Type: Breast Fed Length of feed: 50 min  LATCH Score                   Interventions    Lactation Tools Discussed/Used     Consult Status      Huston FoleyMOULDEN, Meztli Llanas S 08/08/2017, 8:59 AM

## 2017-09-03 DIAGNOSIS — Z659 Problem related to unspecified psychosocial circumstances: Secondary | ICD-10-CM | POA: Diagnosis not present

## 2017-09-03 DIAGNOSIS — K210 Gastro-esophageal reflux disease with esophagitis: Secondary | ICD-10-CM | POA: Diagnosis not present

## 2017-09-03 DIAGNOSIS — Z00121 Encounter for routine child health examination with abnormal findings: Secondary | ICD-10-CM | POA: Diagnosis not present

## 2017-09-19 DIAGNOSIS — Z1389 Encounter for screening for other disorder: Secondary | ICD-10-CM | POA: Diagnosis not present

## 2017-09-25 DIAGNOSIS — Z30013 Encounter for initial prescription of injectable contraceptive: Secondary | ICD-10-CM | POA: Diagnosis not present

## 2017-10-09 DIAGNOSIS — K644 Residual hemorrhoidal skin tags: Secondary | ICD-10-CM | POA: Insufficient documentation

## 2018-01-23 ENCOUNTER — Encounter: Payer: Self-pay | Admitting: Family Medicine

## 2018-01-23 ENCOUNTER — Ambulatory Visit: Payer: BLUE CROSS/BLUE SHIELD | Attending: Family Medicine | Admitting: Family Medicine

## 2018-01-23 VITALS — BP 94/67 | HR 88 | Temp 98.5°F | Resp 16 | Ht 65.0 in | Wt 166.2 lb

## 2018-01-23 DIAGNOSIS — D509 Iron deficiency anemia, unspecified: Secondary | ICD-10-CM | POA: Diagnosis not present

## 2018-01-23 DIAGNOSIS — Z811 Family history of alcohol abuse and dependence: Secondary | ICD-10-CM | POA: Insufficient documentation

## 2018-01-23 DIAGNOSIS — R51 Headache: Secondary | ICD-10-CM | POA: Insufficient documentation

## 2018-01-23 DIAGNOSIS — Z905 Acquired absence of kidney: Secondary | ICD-10-CM | POA: Diagnosis not present

## 2018-01-23 DIAGNOSIS — Z885 Allergy status to narcotic agent status: Secondary | ICD-10-CM | POA: Diagnosis not present

## 2018-01-23 DIAGNOSIS — N912 Amenorrhea, unspecified: Secondary | ICD-10-CM | POA: Diagnosis not present

## 2018-01-23 NOTE — Progress Notes (Signed)
Pt is wanting to change her birth control

## 2018-01-23 NOTE — Progress Notes (Signed)
Subjective:    Patient ID: Rita Collier, female    DOB: 1985-05-23, 32 y.o.   MRN: [id number]  HPI 32 yo female new to the practice.  Patient reports no current significant health issues.  Patient does have a history of anemia and required an iron transfusion during her pregnancy.  Patient gave birth to her son on 08/06/2017.  Patient reports that she is still breast-feeding.  Patient states that she initially was placed on Depo-Provera for contraception but she had daily bleeding therefore her contraceptive was switched to a pill.  Patient states that she cannot recall the name of the pill but she states that she was told that it would be safe for her to take while breast-feeding.  Patient states that the pill is causing her to have headaches as well as making her feel depressed and anxious.  Patient would like to switch to a different birth control pill.  Patient reports that she has not had her period since giving birth other than the bleeding she experienced with use of Depo-Provera.  Patient also has some fatigue but she attributes this to be in a new mother.  Patient denies any chest pain or palpitations, no shortness of breath or cough.  Patient has had no dizziness.  No peripheral edema.  No abdominal pain, no urinary frequency or dysuria.       Past Medical History:  Diagnosis Date  . Anemia    iron infusion   . GERD (gastroesophageal reflux disease)   . Headache   . Hx of kidney removal 12/2014  . Spinal headache    blood patch after cerclage   Past Surgical History:  Procedure Laterality Date  . APPENDECTOMY  2007  . CERVICAL CERCLAGE    . INDUCED ABORTION    . KIDNEY DONATION  2016   Family History  Problem Relation Age of Onset  . Stomach cancer Maternal Aunt   . Hypertension Maternal Aunt   . Diabetes Maternal Aunt   . Pancreatic cancer Maternal Uncle   . Hypertension Maternal Uncle   . Diabetes Maternal Uncle   . Alcohol abuse Mother   . Alcohol abuse Father   . Alcohol  abuse Sister   . Alcohol abuse Brother    Social History   Tobacco Use  . Smoking status: Never Smoker  . Smokeless tobacco: Never Used  Substance Use Topics  . Alcohol use: Yes    Comment: occ when not pregnant  . Drug use: No   Allergies  Allergen Reactions  . Other Nausea And Vomiting and Other (See Comments)    Iron medication- not sure of name. Arm swelling, hands and feet tingling  . Vicodin [Hydrocodone-Acetaminophen] Nausea Only       Review of Systems  Constitutional: Positive for fatigue. Negative for chills and fever.  Respiratory: Negative for cough and shortness of breath.   Cardiovascular: Negative for chest pain, palpitations and leg swelling.  Gastrointestinal: Negative for abdominal pain and nausea.  Genitourinary: Negative for dysuria and frequency.  Musculoskeletal: Negative for back pain and joint swelling.  Neurological: Positive for headaches. Negative for dizziness.       Objective:   Physical Exam BP 94/67   Pulse 88   Temp 98.5 F (36.9 C) (Oral)   Resp 16   Ht 5\' 5"  (1.651 m)   Wt 166 lb 3.2 oz (75.4 kg)   SpO2 97%   Breastfeeding? Yes   BMI 27.66 kg/m  Vital signs and nurse's notes  reviewed General-well-nourished, well-developed female in no acute distress Neck-supple, no lymphadenopathy, no thyromegaly, no carotid bruit Cardiovascular-regular rate and rhythm Abdomen-soft, nontender Back-no CVA tenderness Extremities-no edema Psychologic-normal mood and judgment      Assessment & Plan:  1. Absent menses Patient reports that she has not had her menses since giving birth to her son in April.  Patient states that she is currently on a type of birth control pill that she was told was safe during pregnancy.  Patient however states that the medication is causing her to have headaches and feel depressed.  I discussed with the patient that she was likely on a progestin only pill.  I discussed with the patient that she could be placed on a  combination birth control pill such as the ones that she states she has taken in the past without problems however this may cause her to have a reduction in her milk production or to stop lactating completely.  Patient would like to continue breast-feeding for at least another 6 months therefore does not wish to be on a combination birth control pill and instead request possible Nexplanon for contraception.  Patient will be referred to GYN for further evaluation and treatment.  Patient did have urine pregnancy test done at today's visit which was negative. - POCT urine pregnancy - Ambulatory referral to Gynecology  2. Iron deficiency anemia, unspecified iron deficiency anemia type Patient with iron deficiency anemia and patient's last hemoglobin per medical chart was low at 10.2 in April of this year.  Patient will have CBC at today's visit and patient will be notified if she needs to start an iron supplement. - CBC with Differential  3. Breast feeding status of mother Patient is currently breast-feeding and would like to continue breast-feeding for another 6 months therefore patient declined combination birth control pill after I discussed with her that the pill could cause her to have a decrease in milk production or to stop being able to produce milk for breast-feeding.  *Influenza immunization was offered at today's visit which patient declined  An After Visit Summary was printed and given to the patient.  Return if symptoms worsen or fail to improve, for as needed and yearly.

## 2018-01-24 ENCOUNTER — Telehealth: Payer: Self-pay

## 2018-01-24 LAB — CBC WITH DIFFERENTIAL/PLATELET
Basophils Absolute: 0 x10E3/uL (ref 0.0–0.2)
Basos: 0 %
EOS (ABSOLUTE): 0.2 x10E3/uL (ref 0.0–0.4)
Eos: 3 %
Hematocrit: 39.1 % (ref 34.0–46.6)
Hemoglobin: 12.6 g/dL (ref 11.1–15.9)
Immature Grans (Abs): 0 x10E3/uL (ref 0.0–0.1)
Immature Granulocytes: 0 %
Lymphocytes Absolute: 2.6 x10E3/uL (ref 0.7–3.1)
Lymphs: 42 %
MCH: 28.4 pg (ref 26.6–33.0)
MCHC: 32.2 g/dL (ref 31.5–35.7)
MCV: 88 fL (ref 79–97)
Monocytes Absolute: 0.4 x10E3/uL (ref 0.1–0.9)
Monocytes: 6 %
Neutrophils Absolute: 2.9 x10E3/uL (ref 1.4–7.0)
Neutrophils: 49 %
Platelets: 319 x10E3/uL (ref 150–450)
RBC: 4.44 x10E6/uL (ref 3.77–5.28)
RDW: 13.3 % (ref 12.3–15.4)
WBC: 6 x10E3/uL (ref 3.4–10.8)

## 2018-01-24 LAB — POCT URINE PREGNANCY: Preg Test, Ur: NEGATIVE

## 2018-01-24 NOTE — Telephone Encounter (Signed)
Contacted pt to go over lab results pt didn't answer left a detailed vm informing pt of results and if she has any questions or concerns to give me a call  If pt calls back please give results: CBC shows she is no longer anemic

## 2018-02-03 DIAGNOSIS — Z3009 Encounter for other general counseling and advice on contraception: Secondary | ICD-10-CM | POA: Diagnosis not present

## 2018-04-04 ENCOUNTER — Ambulatory Visit (HOSPITAL_COMMUNITY)
Admission: EM | Admit: 2018-04-04 | Discharge: 2018-04-04 | Disposition: A | Discharge status: Home / Self Care | Payer: BLUE CROSS/BLUE SHIELD | Attending: Family Medicine | Admitting: Family Medicine

## 2018-04-04 ENCOUNTER — Encounter (HOSPITAL_COMMUNITY): Payer: Self-pay

## 2018-04-04 ENCOUNTER — Telehealth: Payer: Self-pay | Admitting: Family Medicine

## 2018-04-04 DIAGNOSIS — R05 Cough: Secondary | ICD-10-CM

## 2018-04-04 DIAGNOSIS — R059 Cough, unspecified: Secondary | ICD-10-CM

## 2018-04-04 NOTE — ED Triage Notes (Signed)
Pt presents with cough x 4 days. Pt is breast feeding so has not tried many otc medications.

## 2018-04-04 NOTE — ED Provider Notes (Signed)
[facility]    CSN: [id number] Arrival date & time: 04/04/18  1732     History   Chief Complaint Chief Complaint  Patient presents with  . URI    HPI Rita Collier is a 32 y.o. female.   She is presenting with a 4-day history of cough.  Her symptoms are worse at night.  The cough is non-productive.  She has tried some over-the-counter medications with no improvement.  She denies any tobacco history, asthma, or COPD.  She feels like her symptoms are staying the same.  She has been around someone with similar symptoms.  Denies any fevers.  She is currently breast-feeding.  HPI  Past Medical History:  Diagnosis Date  . Anemia    iron infusion   . GERD (gastroesophageal reflux disease)   . Headache   . Hx of kidney removal 12/2014  . Spinal headache    blood patch after cerclage    Patient Active Problem List   Diagnosis Date Noted  . External hemorrhoids 10/09/2017    Past Surgical History:  Procedure Laterality Date  . APPENDECTOMY  2007  . CERVICAL CERCLAGE    . INDUCED ABORTION    . KIDNEY DONATION  2016    OB History    Gravida  2   Para  1   Term  1   Preterm      AB  1   Living  1     SAB      TAB  1   Ectopic      Multiple  0   Live Births  1            Home Medications    Prior to Admission medications   Medication Sig Start Date End Date Taking? Authorizing Provider  acetaminophen (TYLENOL) 325 MG tablet Take 650 mg by mouth every 6 (six) hours as needed for mild pain or headache.    [provider]  norethindrone (MICRONOR,CAMILA,ERRIN) 0.35 MG tablet  01/07/18   [provider]    Family History Family History  Problem Relation Age of Onset  . Stomach cancer Maternal Aunt   . Hypertension Maternal Aunt   . Diabetes Maternal Aunt   . Pancreatic cancer Maternal Uncle   . Hypertension Maternal Uncle   . Diabetes Maternal Uncle   . Alcohol abuse Mother   . Alcohol abuse Father   . Alcohol  abuse Sister   . Alcohol abuse Brother     Social History Social History   Tobacco Use  . Smoking status: Never Smoker  . Smokeless tobacco: Never Used  Substance Use Topics  . Alcohol use: Yes    Comment: occ when not pregnant  . Drug use: No     Allergies   Other and Vicodin [hydrocodone-acetaminophen]   Review of Systems Review of Systems  Constitutional: Negative for fever.  HENT: Positive for congestion.   Respiratory: Positive for cough.   Cardiovascular: Negative for chest pain.  Gastrointestinal: Negative for abdominal distention.  Musculoskeletal: Negative for back pain.     Physical Exam Triage Vital Signs ED Triage Vitals  Enc Vitals Group     BP 04/04/18 1831 104/61     Pulse Rate 04/04/18 1831 90     Resp 04/04/18 1831 18     Temp 04/04/18 1831 98.6 F (37 C)     Temp src --      SpO2 04/04/18 1831 99 %     Weight --  Height --      Head Circumference --      Peak Flow --      Pain Score 04/04/18 1829 6     Pain Loc --      Pain Edu? --      Excl. in GC? --    No data found.  Updated Vital Signs BP 104/61   Pulse 90   Temp 98.6 F (37 C)   Resp 18   LMP 03/29/2018   SpO2 99%   Visual Acuity Right Eye Distance:   Left Eye Distance:   Bilateral Distance:    Right Eye Near:   Left Eye Near:    Bilateral Near:     Physical Exam Gen: NAD, alert, cooperative with exam, well-appearing ENT: normal lips, normal nasal mucosa, tympanic membranes clear and intact bilaterally, normal oropharynx, no cervical lymphadenopathy Eye: normal EOM, normal conjunctiva and lids CV:  no edema, +2 pedal pulses, regular rate and rhythm, S1-S2  Resp: no accessory muscle use, non-labored, clear to auscultation bilaterally, no crackles or wheezes  Skin: no rashes, no areas of induration  Neuro: normal tone, normal sensation to touch Psych:  normal insight, alert and oriented MSK: Normal gait, normal strength    UC Treatments / Results   Labs (all labs ordered are listed, but only abnormal results are displayed) Labs Reviewed - No data to display  EKG None  Radiology No results found.  Procedures Procedures (including critical care time)  Medications Ordered in UC Medications - No data to display  Initial Impression / Assessment and Plan / UC Course  I have reviewed the triage vital signs and the nursing notes.  Pertinent labs & imaging results that were available during my care of the patient were reviewed by me and considered in my medical decision making (see chart for details).     Rita Collier is a 32 year old female is presenting with an upper respiratory infection associated with a cough.  Likely viral in origin.  Counseled on supportive care in which medications that are safe to take with breast-feeding.  Given indications to follow-up.  Final Clinical Impressions(s) / UC Diagnoses   Final diagnoses:  Cough     Discharge Instructions     Please try Zyrtec or Allegra as long as you take this in small amounts. Trying things like honey, lozenges, or using humidifier.  You can also use Vicks vapor rub. You can use Chloraseptic spray if your throat is hurting. Please try Tylenol for the pain Please try using heat over the back or the chest if you have pain related to coughing Please follow-up with your primary doctor if your symptoms do not improve after 7 days    ED Prescriptions    None     Controlled Substance Prescriptions [place] Controlled Substance Registry consulted? Not Applicable   Myra RudeSchmitz, Reef Achterberg E, MD 04/04/18 2122

## 2018-04-04 NOTE — Telephone Encounter (Signed)
Patient called requesting to speak with the nurse because she is coughing a lot and has cold like symptoms. Told patient that she would have to schedule an appointment and patient requested to speak with the nurse to see if it's necessary for her to come in. Advised patient to also go to urgent care.

## 2018-04-04 NOTE — Discharge Instructions (Signed)
Please try Zyrtec or Allegra as long as you take this in small amounts. Trying things like honey, lozenges, or using humidifier.  You can also use Vicks vapor rub. You can use Chloraseptic spray if your throat is hurting. Please try Tylenol for the pain Please try using heat over the back or the chest if you have pain related to coughing Please follow-up with your primary doctor if your symptoms do not improve after 7 days

## 2018-04-04 NOTE — Telephone Encounter (Signed)
Pt needs an OV. Has not been seen since 01/23/2018. Unable to inform her of OTC medications that are safe to take. She  Is also breast feeding. She was instructed to call her son's pediatrician office and/or OB/GYN.   She states she was unable to wait until Monday to schedule an appointment for Wednesday/ Thursday. She would go to an UC

## 2018-05-17 ENCOUNTER — Encounter: Payer: Self-pay | Admitting: Emergency Medicine

## 2018-05-17 ENCOUNTER — Ambulatory Visit
Admission: EM | Admit: 2018-05-17 | Discharge: 2018-05-17 | Disposition: A | Discharge status: Home / Self Care | Payer: BLUE CROSS/BLUE SHIELD | Attending: Emergency Medicine | Admitting: Emergency Medicine

## 2018-05-17 DIAGNOSIS — J069 Acute upper respiratory infection, unspecified: Secondary | ICD-10-CM | POA: Diagnosis not present

## 2018-05-17 DIAGNOSIS — B9789 Other viral agents as the cause of diseases classified elsewhere: Secondary | ICD-10-CM | POA: Diagnosis not present

## 2018-05-17 LAB — POCT INFLUENZA A/B
Influenza A, POC: NEGATIVE
Influenza B, POC: NEGATIVE

## 2018-05-17 LAB — POCT RAPID STREP A (OFFICE): RAPID STREP A SCREEN: NEGATIVE

## 2018-05-17 MED ORDER — FLUTICASONE PROPIONATE 50 MCG/ACT NA SUSP: 1.0000 | Freq: Every day | NASAL | 2 refills | Status: AC

## 2018-05-17 MED ORDER — PSEUDOEPH-BROMPHEN-DM 30-2-10 MG/5ML PO SYRP: 5.0000 mL | ORAL_SOLUTION | Freq: Four times a day (QID) | ORAL | 0 refills | Status: AC | PRN

## 2018-05-17 MED ORDER — CETIRIZINE HCL 10 MG PO CAPS: 10.0000 mg | ORAL_CAPSULE | Freq: Every day | ORAL | 0 refills | Status: AC

## 2018-05-17 NOTE — Discharge Instructions (Signed)
You likely having a viral upper respiratory infection. We recommended symptom control. I expect your symptoms to start improving in the next 1-2 weeks.   1. Take a daily allergy pill/anti-histamine like Zyrtec, Claritin, or Store brand consistently for 2 weeks  2. For congestion you may try an oral decongestant like Mucinex. You may also try intranasal flonase nasal spray or saline irrigations (neti pot, sinus cleanse)  3. For your sore throat you may try cepacol lozenges, salt water gargles, throat spray. Treatment of congestion may also help your sore throat.  4. For cough you may try cough syrup provided, or Delsym, Robitussen, Mucinex DM  5. Take Tylenol or Ibuprofen to help with pain/inflammation  6. Stay hydrated, drink plenty of fluids to keep throat coated and less irritated  Honey Tea For cough/sore throat try using a honey-based tea. Use 3 teaspoons of honey with juice squeezed from half lemon. Place shaved pieces of ginger into 1/2-1 cup of water and warm over stove top. Then mix the ingredients and repeat every 4 hours as needed.

## 2018-05-17 NOTE — ED Notes (Signed)
Patient able to ambulate independently  

## 2018-05-17 NOTE — ED Triage Notes (Signed)
Pt presents to [facility] for assessment of 2.5 days of fever, congestion, cough, sore chest with coughing, headaches, sore throat.

## 2018-05-17 NOTE — ED Provider Notes (Signed)
[facility]    CSN: [id number] Arrival date & time: 05/17/18  1517     History   Chief Complaint Chief Complaint  Patient presents with  . Flu-Like Symptoms    HPI Rita Collier is a 33 y.o. female history of GERD, presenting today for evaluation of URI symptoms.  Patient has had cough, congestion, headache and sore throat.  Symptoms began 2 days ago.  She has had a lot of mucus production.  Also noticing some facial pressure and chest discomfort.  She denies shortness of breath.  Denies any fevers.  She has been taking Zyrtec, but has not used many medicines as she is breast-feeding.  She has also tried Tylenol and elderberry.  Denies GI upset.  91-month-old son with similar symptoms.  HPI  Past Medical History:  Diagnosis Date  . Anemia    iron infusion   . GERD (gastroesophageal reflux disease)   . Headache   . Hx of kidney removal 12/2014  . Spinal headache    blood patch after cerclage    Patient Active Problem List   Diagnosis Date Noted  . External hemorrhoids 10/09/2017    Past Surgical History:  Procedure Laterality Date  . APPENDECTOMY  2007  . CERVICAL CERCLAGE    . INDUCED ABORTION    . KIDNEY DONATION  2016    OB History    Gravida  2   Para  1   Term  1   Preterm      AB  1   Living  1     SAB      TAB  1   Ectopic      Multiple  0   Live Births  1            Home Medications    Prior to Admission medications   Medication Sig Start Date End Date Taking? Authorizing Provider  norethindrone (MICRONOR,CAMILA,ERRIN) 0.35 MG tablet  01/07/18  Yes [provider]  acetaminophen (TYLENOL) 325 MG tablet Take 650 mg by mouth every 6 (six) hours as needed for mild pain or headache.    [provider]  brompheniramine-pseudoephedrine-DM 30-2-10 MG/5ML syrup Take 5 mLs by mouth 4 (four) times daily as needed. 05/17/18   ,  C, PA-C  Cetirizine HCl 10 MG CAPS Take 1 capsule (10 mg total) by mouth  daily for 10 days. 05/17/18 05/27/18  ,  C, PA-C  fluticasone (FLONASE) 50 MCG/ACT nasal spray Place 1-2 sprays into both nostrils daily. 05/17/18   , Junius Creamer, PA-C    Family History Family History  Problem Relation Age of Onset  . Stomach cancer Maternal Aunt   . Hypertension Maternal Aunt   . Diabetes Maternal Aunt   . Pancreatic cancer Maternal Uncle   . Hypertension Maternal Uncle   . Diabetes Maternal Uncle   . Alcohol abuse Mother   . Alcohol abuse Father   . Alcohol abuse Sister   . Alcohol abuse Brother     Social History Social History   Tobacco Use  . Smoking status: Never Smoker  . Smokeless tobacco: Never Used  Substance Use Topics  . Alcohol use: Yes    Comment: occ when not pregnant  . Drug use: No     Allergies   Other and Vicodin [hydrocodone-acetaminophen]   Review of Systems Review of Systems  Constitutional: Negative for activity change, appetite change, chills, fatigue and fever.  HENT: Positive for congestion, rhinorrhea, sinus pressure and sore  throat. Negative for ear pain and trouble swallowing.   Eyes: Negative for discharge and redness.  Respiratory: Positive for cough and chest tightness. Negative for shortness of breath.   Cardiovascular: Negative for chest pain.  Gastrointestinal: Negative for abdominal pain, diarrhea, nausea and vomiting.  Musculoskeletal: Negative for myalgias.  Skin: Negative for rash.  Neurological: Negative for dizziness, light-headedness and headaches.     Physical Exam Triage Vital Signs ED Triage Vitals [05/17/18 1528]  Enc Vitals Group     BP 110/78     Pulse Rate (!) 113     Resp 18     Temp 97.9 F (36.6 C)     Temp Source Oral     SpO2 97 %     Weight      Height      Head Circumference      Peak Flow      Pain Score 6     Pain Loc      Pain Edu?      Excl. in GC?    No data found.  Updated Vital Signs BP 110/78 (BP Location: Left Arm)   Pulse (!) 113   Temp 97.9 F  (36.6 C) (Oral)   Resp 18   SpO2 97%   Visual Acuity Right Eye Distance:   Left Eye Distance:   Bilateral Distance:    Right Eye Near:   Left Eye Near:    Bilateral Near:     Physical Exam Vitals signs and nursing note reviewed.  Constitutional:      General: She is not in acute distress.    Appearance: She is well-developed.  HENT:     Head: Normocephalic and atraumatic.     Ears:     Comments: Bilateral ears without tenderness to palpation of external auricle, tragus and mastoid, EAC's without erythema or swelling, TM's with good bony landmarks and cone of light. Non erythematous.     Nose:     Comments: Nasal mucosa erythematous, right turbinate swollen    Mouth/Throat:     Comments: Oral mucosa pink and moist, no tonsillar enlargement or exudate. Posterior pharynx patent and nonerythematous, uvula appears mildly erythematous and swollen, but no deviation. Normal phonation.  Eyes:     Conjunctiva/sclera: Conjunctivae normal.  Neck:     Musculoskeletal: Neck supple.  Cardiovascular:     Rate and Rhythm: Normal rate and regular rhythm.     Heart sounds: No murmur.  Pulmonary:     Effort: Pulmonary effort is normal. No respiratory distress.     Breath sounds: Normal breath sounds.     Comments: Breathing comfortably at rest, CTABL, no wheezing, rales or other adventitious sounds auscultated Abdominal:     Palpations: Abdomen is soft.     Tenderness: There is no abdominal tenderness.  Skin:    General: Skin is warm and dry.  Neurological:     Mental Status: She is alert.      UC Treatments / Results  Labs (all labs ordered are listed, but only abnormal results are displayed) Labs Reviewed  POCT RAPID STREP A (OFFICE) - Normal  POCT INFLUENZA A/B - Normal  CULTURE, GROUP A STREP [facility])    EKG None  Radiology No results found.  Procedures Procedures (including critical care time)  Medications Ordered in UC Medications - No data to display  Initial  Impression / Assessment and Plan / UC Course  I have reviewed the triage vital signs and the nursing notes.  Pertinent  labs & imaging results that were available during my care of the patient were reviewed by me and considered in my medical decision making (see chart for details).     Strep test and influenza test negative.  Most likely other viral etiology.  Will recommend symptomatic management.  Initially provided recommendations below, but after further discussion given patient breast-feeding, advised mainly to stick to Tylenol, natural remedies like honey and ginger tea, advised not to pick up the prescriptions sent to pharmacy and to stick to saline spray for nasal congestion.  Rest, hydration, continue to monitor for resolution with time.Discussed strict return precautions. Patient verbalized understanding and is agreeable with plan.  Final Clinical Impressions(s) / UC Diagnoses   Final diagnoses:  Viral URI with cough     Discharge Instructions     You likely having a viral upper respiratory infection. We recommended symptom control. I expect your symptoms to start improving in the next 1-2 weeks.   1. Take a daily allergy pill/anti-histamine like Zyrtec, Claritin, or Store brand consistently for 2 weeks  2. For congestion you may try an oral decongestant like Mucinex. You may also try intranasal flonase nasal spray or saline irrigations (neti pot, sinus cleanse)  3. For your sore throat you may try cepacol lozenges, salt water gargles, throat spray. Treatment of congestion may also help your sore throat.  4. For cough you may try cough syrup provided, or Delsym, Robitussen, Mucinex DM  5. Take Tylenol or Ibuprofen to help with pain/inflammation  6. Stay hydrated, drink plenty of fluids to keep throat coated and less irritated  Honey Tea For cough/sore throat try using a honey-based tea. Use 3 teaspoons of honey with juice squeezed from half lemon. Place shaved pieces of  ginger into 1/2-1 cup of water and warm over stove top. Then mix the ingredients and repeat every 4 hours as needed.   ED Prescriptions    Medication Sig Dispense Auth. Provider   fluticasone (FLONASE) 50 MCG/ACT nasal spray Place 1-2 sprays into both nostrils daily. 1 g ,  C, PA-C   Cetirizine HCl 10 MG CAPS Take 1 capsule (10 mg total) by mouth daily for 10 days. 10 capsule ,  C, PA-C   brompheniramine-pseudoephedrine-DM 30-2-10 MG/5ML syrup Take 5 mLs by mouth 4 (four) times daily as needed. 120 mL ,  C, PA-C     Controlled Substance Prescriptions [place] Controlled Substance Registry consulted? Not Applicable   Lew Dawes,  C, [place]PA-C 05/17/18 1708

## 2018-05-21 LAB — CULTURE, GROUP A STREP (THRC)

## 2018-07-24 ENCOUNTER — Encounter: Payer: Self-pay | Admitting: Family Medicine

## 2018-07-24 NOTE — Telephone Encounter (Signed)
Patient Acyclovir refill request.

## 2018-11-10 DIAGNOSIS — Z3201 Encounter for pregnancy test, result positive: Secondary | ICD-10-CM | POA: Diagnosis not present

## 2018-11-10 DIAGNOSIS — N912 Amenorrhea, unspecified: Secondary | ICD-10-CM | POA: Diagnosis not present

## 2018-11-25 DIAGNOSIS — O020 Blighted ovum and nonhydatidiform mole: Secondary | ICD-10-CM | POA: Diagnosis not present

## 2018-11-25 DIAGNOSIS — Z3A09 9 weeks gestation of pregnancy: Secondary | ICD-10-CM | POA: Diagnosis not present

## 2018-11-25 DIAGNOSIS — O26891 Other specified pregnancy related conditions, first trimester: Secondary | ICD-10-CM | POA: Diagnosis not present

## 2018-12-05 DIAGNOSIS — O020 Blighted ovum and nonhydatidiform mole: Secondary | ICD-10-CM | POA: Diagnosis not present

## 2018-12-12 DIAGNOSIS — O021 Missed abortion: Secondary | ICD-10-CM | POA: Diagnosis not present

## 2018-12-19 DIAGNOSIS — O021 Missed abortion: Secondary | ICD-10-CM | POA: Diagnosis not present

## 2018-12-25 DIAGNOSIS — Z03818 Encounter for observation for suspected exposure to other biological agents ruled out: Secondary | ICD-10-CM | POA: Diagnosis not present

## 2018-12-26 DIAGNOSIS — Z3169 Encounter for other general counseling and advice on procreation: Secondary | ICD-10-CM | POA: Diagnosis not present

## 2018-12-26 DIAGNOSIS — Z3202 Encounter for pregnancy test, result negative: Secondary | ICD-10-CM | POA: Diagnosis not present

## 2018-12-26 DIAGNOSIS — Z3040 Encounter for surveillance of contraceptives, unspecified: Secondary | ICD-10-CM | POA: Diagnosis not present

## 2019-01-18 DIAGNOSIS — Z1159 Encounter for screening for other viral diseases: Secondary | ICD-10-CM | POA: Diagnosis not present

## 2019-02-06 MED FILL — LEVONOR-ETH ESTRA 0.09-0.02: 90-20 | 28 days supply | Qty: 28 | Fill #0

## 2019-03-04 ENCOUNTER — Ambulatory Visit: Payer: BLUE CROSS/BLUE SHIELD | Admitting: Family Medicine

## 2019-03-09 MED FILL — LEVONOR-ETH ESTRA 0.09-0.02: 90-20 | 28 days supply | Qty: 28 | Fill #1

## 2019-05-12 MED FILL — LEVONOR-ETH ESTRA 0.09-0.02: 90-20 | 28 days supply | Qty: 28 | Fill #2

## 2019-06-11 ENCOUNTER — Telehealth: Payer: Self-pay | Admitting: Emergency Medicine

## 2019-06-11 DIAGNOSIS — B001 Herpesviral vesicular dermatitis: Secondary | ICD-10-CM

## 2019-06-11 MED ORDER — ACYCLOVIR 800 MG PO TABS: 800.0000 mg | ORAL_TABLET | Freq: Two times a day (BID) | ORAL | 0 refills | Status: AC

## 2019-06-11 MED FILL — ACYCLOVIR 800 MG TABLET: 800 | 7 days supply | Qty: 14 | Fill #0

## 2019-06-11 NOTE — Progress Notes (Signed)
We are sorry that you are not feeling well.  Here is how we plan to help!  Based on what you have shared with me it does look like you have a viral infection.    Most cold sores or fever blisters are small fluid filled blisters around the mouth caused by herpes simplex virus.  The most common strain of the virus causing cold sores is herpes simplex virus 1.  It can be spread by skin contact, sharing eating utensils, or even sharing towels.  Cold sores are contagious to other people until dry. (Approximately 5-7 days).  Wash your hands. You can spread the virus to your eyes through handling your contact lenses after touching the lesions.  Most people experience pain at the sight or tingling sensations in their lips that may begin before the ulcers erupt.  Herpes simplex is treatable but not curable.  It may lie dormant for a long time and then reappear due to stress or prolonged sun exposure.  Many patients have success in treating their cold sores with an over the counter topical called Abreva.  You may apply the cream up to 5 times daily (maximum 10 days) until healing occurs.  If you would like to use an oral antiviral medication to speed the healing of your cold sore, I have sent a prescription to your local pharmacy Acyclovir 800 mg take one by mouth twice a day for 7 days    HOME CARE:   Wash your hands frequently.  Do not pick at or rub the sore.  Don't open the blisters.  Avoid kissing other people during this time.  Avoid sharing drinking glasses, eating utensils, or razors.  Do not handle contact lenses unless you have thoroughly washed your hands with soap and warm water!  Avoid oral sex during this time.  Herpes from sores on your mouth can spread to your partner's genital area.  Avoid contact with anyone who has eczema or a weakened immune system.  Cold sores are often triggered by exposure to intense sunlight, use a lip balm containing a sunscreen (SPF 30 or  higher).  GET HELP RIGHT AWAY IF:   Blisters look infected.  Blisters occur near or in the eye.  Symptoms last longer than 10 days.  Your symptoms become worse.  MAKE SURE YOU:   Understand these instructions.  Will watch your condition.  Will get help right away if you are not doing well or get worse.    Your e-visit answers were reviewed by a board certified advanced clinical practitioner to complete your personal care plan.  Depending upon the condition, your plan could have  Included both over the counter or prescription medications.    Please review your pharmacy choice.  Be sure that the pharmacy you have chosen is open so that you can pick up your prescription now.  If there is a problem you can message your provider in [place] to have the prescription routed to another pharmacy.    Your safety is important to [place].  If you have drug allergies check our prescription carefully.  For the next 24 hours you can use MyChart to ask questions about today's visit, request a non-urgent call back, or ask for a work or school excuse from your e-visit provider.  You will get an email in the next two days asking about your experience.  I hope that your e-visit has been valuable and will speed your recovery.   Approximately 5 minutes was used in  reviewing the patient's chart, questionnaire, prescribing medications, and documentation.

## 2019-06-26 MED FILL — LEVONOR-ETH ESTRA 0.09-0.02: 90-20 | 28 days supply | Qty: 28 | Fill #3

## 2019-08-18 ENCOUNTER — Emergency Department (HOSPITAL_COMMUNITY)
Admission: EM | Admit: 2019-08-18 | Discharge: 2019-08-19 | Disposition: A | Discharge status: Home / Self Care | Payer: 59 | Attending: Emergency Medicine | Admitting: Emergency Medicine

## 2019-08-18 ENCOUNTER — Encounter (HOSPITAL_COMMUNITY): Payer: Self-pay | Admitting: Obstetrics and Gynecology

## 2019-08-18 DIAGNOSIS — R519 Headache, unspecified: Secondary | ICD-10-CM | POA: Diagnosis not present

## 2019-08-18 DIAGNOSIS — R202 Paresthesia of skin: Secondary | ICD-10-CM | POA: Insufficient documentation

## 2019-08-18 DIAGNOSIS — R110 Nausea: Secondary | ICD-10-CM | POA: Insufficient documentation

## 2019-08-18 DIAGNOSIS — R200 Anesthesia of skin: Secondary | ICD-10-CM | POA: Diagnosis not present

## 2019-08-18 MED ORDER — METOCLOPRAMIDE HCL 5 MG/ML IJ SOLN
10.0000 mg | Freq: Once | INTRAMUSCULAR | Status: AC
  Administered 2019-08-18: 10 mg via INTRAVENOUS
  Filled 2019-08-18: qty 2

## 2019-08-18 MED ORDER — DIPHENHYDRAMINE HCL 50 MG/ML IJ SOLN
25.0000 mg | Freq: Once | INTRAMUSCULAR | Status: AC
  Administered 2019-08-18: 25 mg via INTRAVENOUS
  Filled 2019-08-18: qty 1

## 2019-08-18 MED ORDER — SODIUM CHLORIDE 0.9 % IV BOLUS
1000.0000 mL | Freq: Once | INTRAVENOUS | Status: AC
Start: 2019-08-18 — End: 2019-08-19
  Administered 2019-08-18: 1000 mL via INTRAVENOUS

## 2019-08-18 NOTE — ED Triage Notes (Signed)
Per EMS: Patient is coming from home with complaint of headache following her second COVID vaccine. Patient reports she has also had some general weakness and fatigue. Patient had similar symptoms with the first vaccine but wanted to be checked out today.

## 2019-08-18 NOTE — ED Provider Notes (Signed)
[facility]-EMERGENCY DEPT Provider Note   CSN: [id number] Arrival date & time: 08/18/19  2207     History Chief Complaint  Patient presents with  . Headache    Rita Collier is a 34 y.o. female past medical history of anemia, GERD presents for evaluation of headache that began about 6:30 PM this afternoon.  Patient reports that she started developing a headache and states that it got progressively worse.  She states headache started off mild and then progressively worsened.  She felt like it was a throbbing headache.  She felt like "it was choking her."  She felt she had an episode where her leg got cold and numb and she felt tingling in her bilateral hands.  She reported some associated blurry vision.  He took Tylenol prior to coming to the emergency department, which she states did help her headache.  Patient states that now her headache has improved but she still has a right-sided throbbing headache.  She states she feels like it is behind her eye.  She states she does not get headaches frequently.  She reported some associated nausea but denies any vomiting.  No preceding trauma, injury, fall.  She denies any fevers, vision changes, vomiting, numbness/weakness of her arms or legs now.  She is not currently on OCPs denies any recent travel. The history is provided by the patient.       Past Medical History:  Diagnosis Date  . Anemia    iron infusion   . GERD (gastroesophageal reflux disease)   . Headache   . Hx of kidney removal 12/2014  . Spinal headache    blood patch after cerclage    Patient Active Problem List   Diagnosis Date Noted  . External hemorrhoids 10/09/2017    Past Surgical History:  Procedure Laterality Date  . APPENDECTOMY  2007  . CERVICAL CERCLAGE    . INDUCED ABORTION    . KIDNEY DONATION  2016     OB History    Gravida  2   Para  1   Term  1   Preterm      AB  1   Living  1     SAB      TAB  1   Ectopic      Multiple  0   Live Births  1           Family History  Problem Relation Age of Onset  . Stomach cancer Maternal Aunt   . Hypertension Maternal Aunt   . Diabetes Maternal Aunt   . Pancreatic cancer Maternal Uncle   . Hypertension Maternal Uncle   . Diabetes Maternal Uncle   . Alcohol abuse Mother   . Alcohol abuse Father   . Alcohol abuse Sister   . Alcohol abuse Brother     Social History   Tobacco Use  . Smoking status: Never Smoker  . Smokeless tobacco: Never Used  Substance Use Topics  . Alcohol use: Yes    Comment: occ when not pregnant  . Drug use: No    Home Medications Prior to Admission medications   Medication Sig Start Date End Date Taking? Authorizing Provider  acetaminophen (TYLENOL) 325 MG tablet Take 650 mg by mouth every 6 (six) hours as needed for mild pain or headache.    [provider]  acyclovir (ZOVIRAX) 800 MG tablet Take 1 tablet (800 mg total) by mouth 2 (two) times daily. 06/11/19   Roxy Horseman, PA-C  brompheniramine-pseudoephedrine-DM 30-2-10 MG/5ML syrup Take 5 mLs by mouth 4 (four) times daily as needed. 05/17/18   Wieters, Hallie C, PA-C  Cetirizine HCl 10 MG CAPS Take 1 capsule (10 mg total) by mouth daily for 10 days. 05/17/18 05/27/18  Wieters, Hallie C, PA-C  fluticasone (FLONASE) 50 MCG/ACT nasal spray Place 1-2 sprays into both nostrils daily. 05/17/18   Wieters, Hallie C, PA-C  norethindrone (MICRONOR,CAMILA,ERRIN) 0.35 MG tablet  01/07/18   [provider]    Allergies    Other and Vicodin [hydrocodone-acetaminophen]  Review of Systems   Review of Systems  Constitutional: Negative for fever.  Respiratory: Negative for cough and shortness of breath.   Cardiovascular: Negative for chest pain.  Gastrointestinal: Positive for nausea. Negative for abdominal pain and vomiting.  Genitourinary: Negative for dysuria and hematuria.  Neurological: Positive for headaches. Negative for weakness and numbness.  All other  systems reviewed and are negative.   Physical Exam Updated Vital Signs BP 105/80   Pulse 83   Temp 98.1 F (36.7 C)   Resp 17   Ht 5\' 5"  (1.651 m)   Wt 80.3 kg   SpO2 100%   BMI 29.45 kg/m   Physical Exam Vitals and nursing note reviewed.  Constitutional:      Appearance: Normal appearance. She is well-developed.  HENT:     Head: Normocephalic and atraumatic.  Eyes:     General: Lids are normal.     Conjunctiva/sclera: Conjunctivae normal.     Pupils: Pupils are equal, round, and reactive to light.     Comments: PERRL. EOMs intact. No nystagmus. No neglect.   Neck:     Comments: Neck is supple and without rigidity.  No meningismal signs. Cardiovascular:     Rate and Rhythm: Normal rate and regular rhythm.     Pulses: Normal pulses.     Heart sounds: Normal heart sounds. No murmur. No friction rub. No gallop.   Pulmonary:     Effort: Pulmonary effort is normal.     Breath sounds: Normal breath sounds.  Abdominal:     Palpations: Abdomen is soft. Abdomen is not rigid.     Tenderness: There is no abdominal tenderness. There is no guarding.  Musculoskeletal:        General: Normal range of motion.     Cervical back: Full passive range of motion without pain.  Skin:    General: Skin is warm and dry.     Capillary Refill: Capillary refill takes less than 2 seconds.  Neurological:     Mental Status: She is alert and oriented to person, place, and time.     Comments: Cranial nerves III-XII intact Follows commands, Moves all extremities  5/5 strength to BUE and BLE  Sensation intact throughout all major nerve distributions No slurred speech. No facial droop.   Psychiatric:        Speech: Speech normal.     ED Results / Procedures / Treatments   Labs (all labs ordered are listed, but only abnormal results are displayed) Labs Reviewed  POC URINE PREG, ED    EKG None  Radiology CT Head Wo Contrast  Result Date: 08/19/2019 CLINICAL DATA:  Headache EXAM: CT  HEAD WITHOUT CONTRAST TECHNIQUE: Contiguous axial images were obtained from the base of the skull through the vertex without intravenous contrast. COMPARISON:  None. FINDINGS: Brain: No acute intracranial abnormality. Specifically, no hemorrhage, hydrocephalus, mass lesion, acute infarction, or significant intracranial injury. Vascular: No hyperdense vessel or unexpected calcification.  Skull: No acute calvarial abnormality. Sinuses/Orbits: Visualized paranasal sinuses and mastoids clear. Orbital soft tissues unremarkable. Other: None IMPRESSION: Normal study. Electronically Signed   By: Rolm Baptise M.D.   On: 08/19/2019 00:52    Procedures Procedures (including critical care time)  Medications Ordered in ED Medications  sodium chloride 0.9 % bolus 1,000 mL (0 mLs Intravenous Stopped 08/19/19 0050)  metoCLOPramide (REGLAN) injection 10 mg (10 mg Intravenous Given 08/18/19 2341)  diphenhydrAMINE (BENADRYL) injection 25 mg (25 mg Intravenous Given 08/18/19 2340)    ED Course  I have reviewed the triage vital signs and the nursing notes.  Pertinent labs & imaging results that were available during my care of the patient were reviewed by me and considered in my medical decision making (see chart for details).    MDM Rules/Calculators/A&P                      34 year old female who presents for evaluation of headache that began this afternoon.  She does not get headaches frequently.  Reports it started off mild in nature and gradually worsened.  Had some nausea, tingling in her bilateral hands that began.  No fever, vomiting.  No difficulty walking.  No preceding trauma, injury.  She reports she took Tylenol and did have some improvement.  On initial ED arrival, she is afebrile, nontoxic-appearing.  Vital signs are stable.  No neuro deficits noted on exam.  She reports a throbbing headache mostly on the right side of her head.  No neuro deficits noted on exam.  I suspect this is most likely tension  headache versus migraine headache.  History/physical exam concerning for CVA, intracranial hemorrhage, dural venous thrombosis.  No meningismal signs would be concerning for infectious etiology.  Patient will get be given migraine cocktail.  Distally, since patient reports no prior history of headaches, will plan for CT to ensure no intracranial mass.  CT Head negative for any acute abnormality.   Reevaluation.  Patient reports headache is completely resolved.  She is resting comfortably without any signs of distress.  She is ambulated to the bathroom without any signs of acute abnormalities.  At this time, patient can be recently discharged.  Instructed patient to follow-up with her primary care doctor. At this time, patient exhibits no emergent life-threatening condition that require further evaluation in ED or admission. Patient had ample opportunity for questions and discussion. All patient's questions were answered with full understanding. Strict return precautions discussed. Patient expresses understanding and agreement to plan.   Portions of this note were generated with [employer]. Dictation errors may occur despite best attempts at proofreading.   Final Clinical Impression(s) / ED Diagnoses Final diagnoses:  Acute nonintractable headache, unspecified headache type    Rx / DC Orders ED Discharge Orders    None       Desma Mcgregor 08/19/19 1610    Merryl Hacker, MD 08/19/19 [phone]

## 2019-08-19 ENCOUNTER — Emergency Department (HOSPITAL_COMMUNITY): Payer: 59

## 2019-08-19 LAB — POC URINE PREG, ED: Preg Test, Ur: NEGATIVE

## 2019-08-19 NOTE — ED Notes (Signed)
Pt ambulated to the bathroom without assistance. Gait steady  

## 2019-08-19 NOTE — ED Notes (Signed)
Pt verbalized d/c instructions and follow up care. Alert and ambulatory. No iv. Leaving with her husband

## 2019-08-19 NOTE — Discharge Instructions (Signed)
Follow-up with your primary care doctor.  Return the emergency department for any worsening headache, vomiting, difficulty walking, numbness/weakness of your arms or legs or any other worsening or concerning symptoms.

## 2019-09-01 ENCOUNTER — Encounter: Payer: Self-pay | Admitting: Family Medicine

## 2020-08-06 IMAGING — CT CT HEAD W/O CM
3 series · 16 of 47 positions shown, 19 images · non-contrast
Comparison: None.

CLINICAL DATA: Headache

EXAM:
CT HEAD WITHOUT CONTRAST
TECHNIQUE: Contiguous axial images were obtained from the base of the skull
through the vertex without intravenous contrast.

[Series 2: head wo · axial · 0.47mm/px · z∈[-93,+32]mm · 10 of 30 slices shown, 13 images]
[im 3/30  brain]
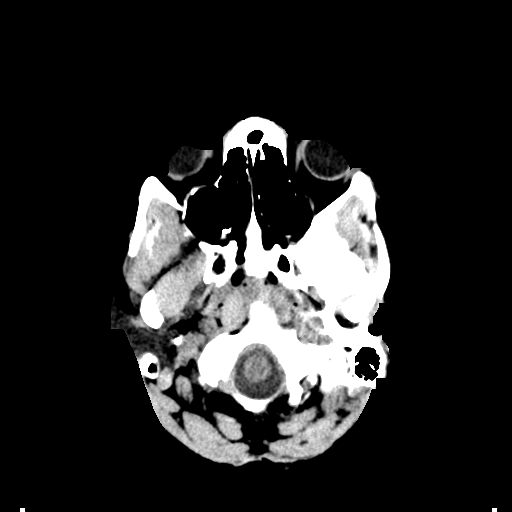
[im 3/30  bone]
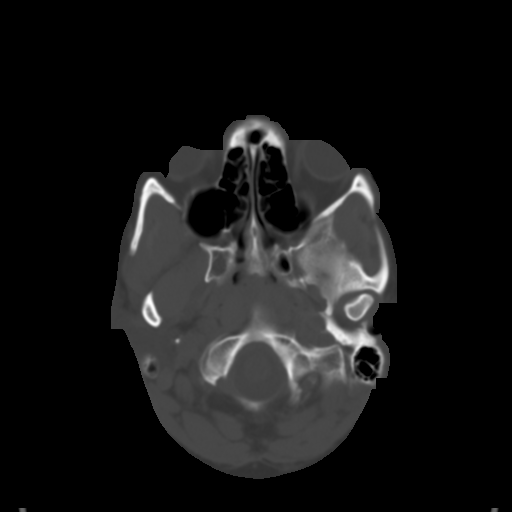
[im 6/30  brain]
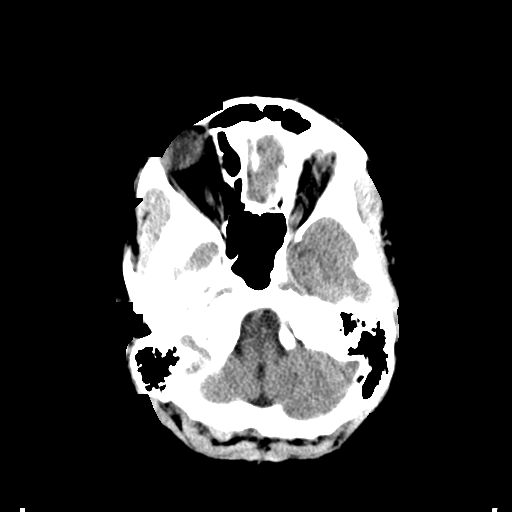
[im 9/30  brain]
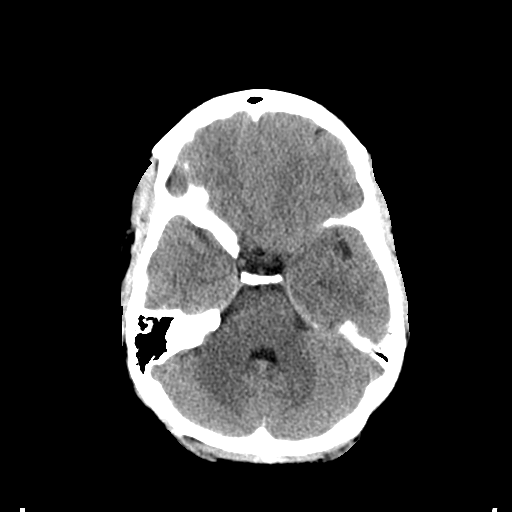
[im 11/30  brain]
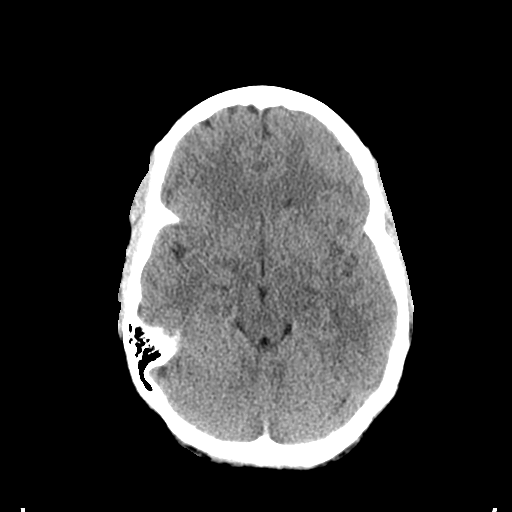
[im 14/30  brain]
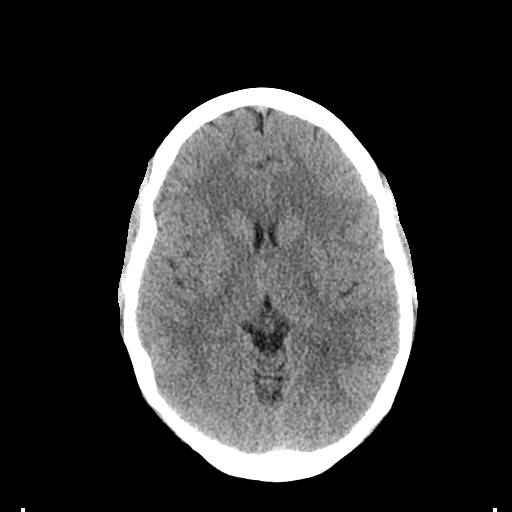
[im 14/30  bone]
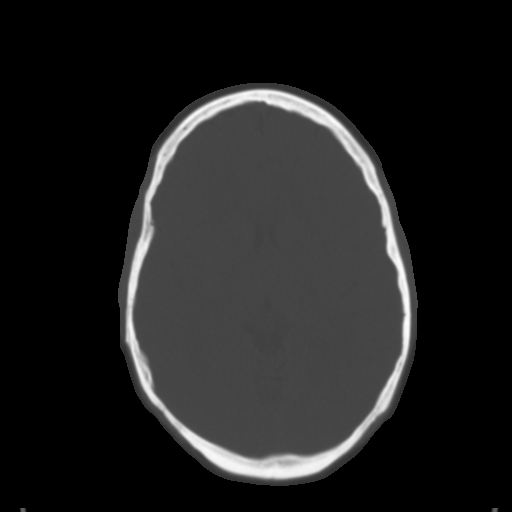
[im 17/30  brain]
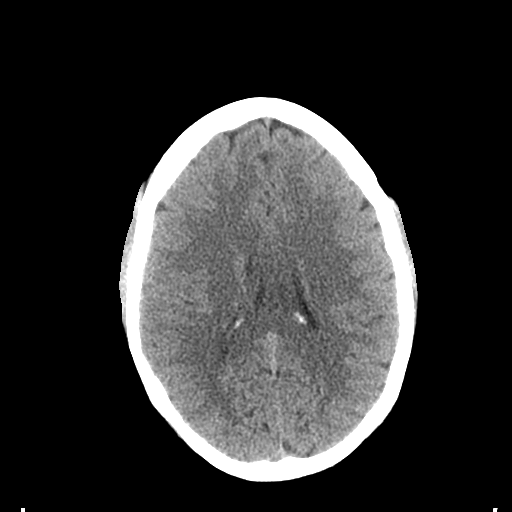
[im 20/30  brain]
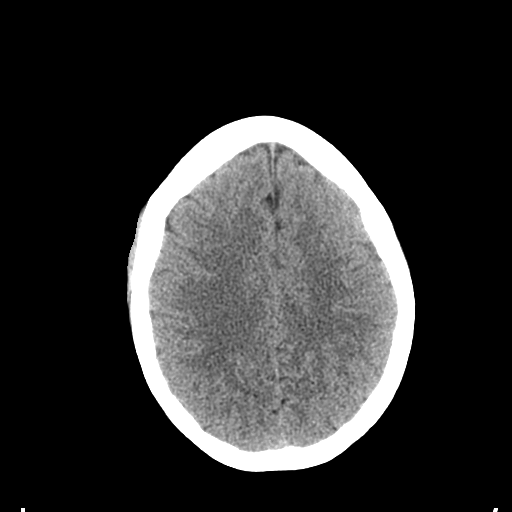
[im 23/30  brain]
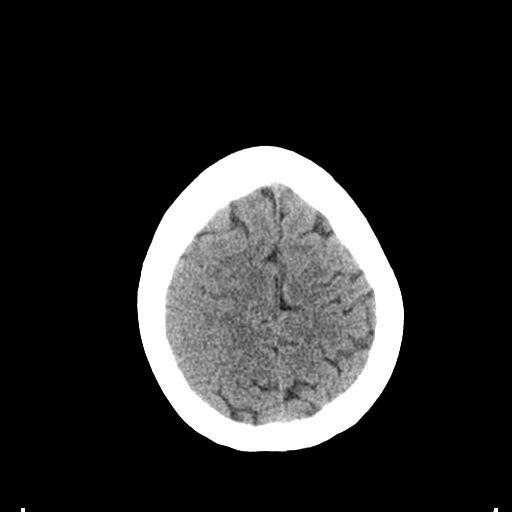
[im 25/30  brain]
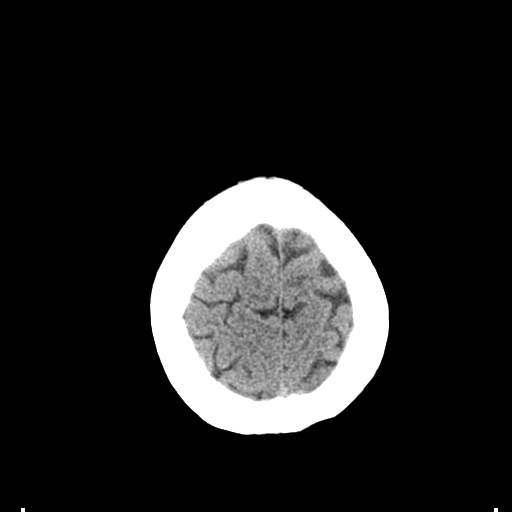
[im 25/30  bone]
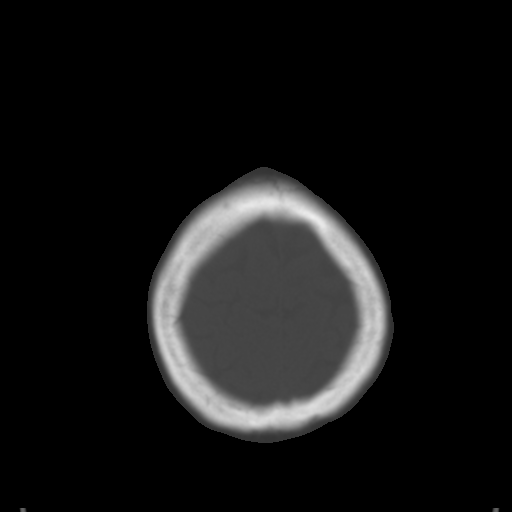
[im 28/30  brain]
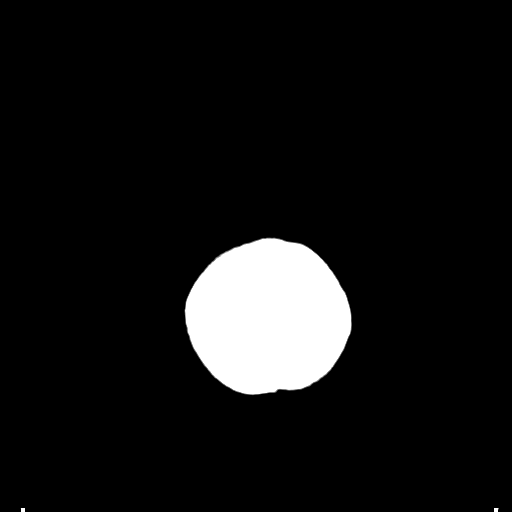

[Series 4: coronal soft tissue · coronal · 0.28mm/px · 3 of 65 slices shown]
[im 22/65  brain]
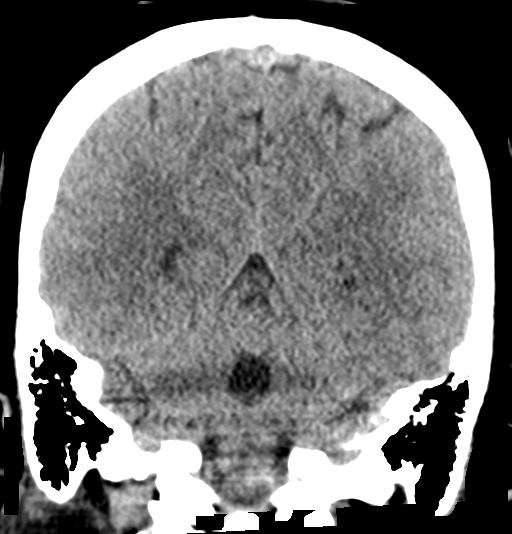
[im 29/65  brain]
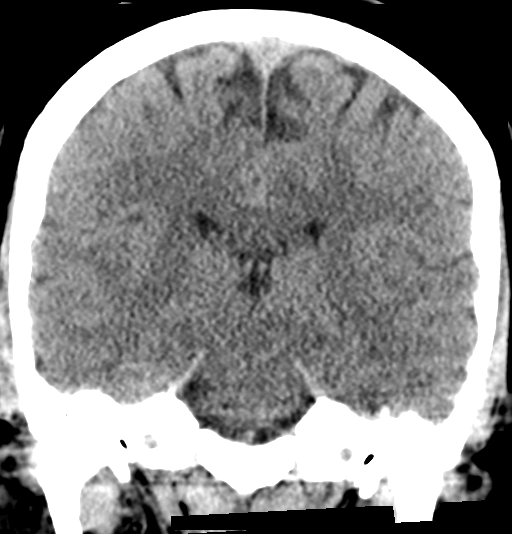
[im 36/65  brain]
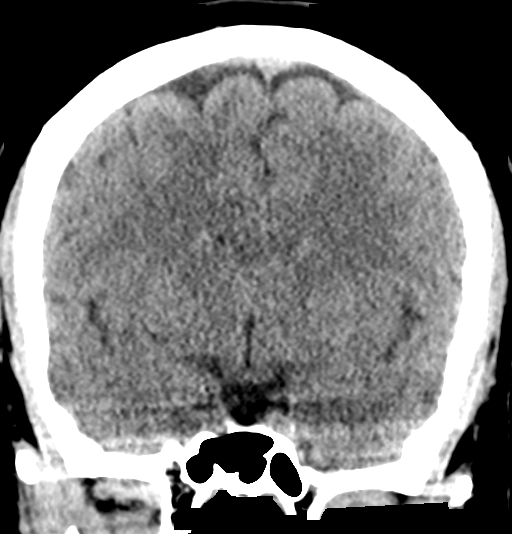

[Series 5: sagittal soft tissue · sagittal · 0.29mm/px · 3 of 49 slices shown]
[im 17/49  brain]
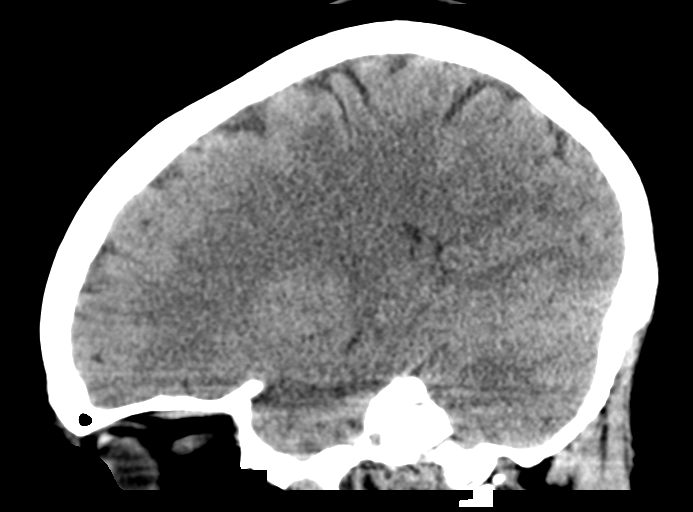
[im 25/49  brain]
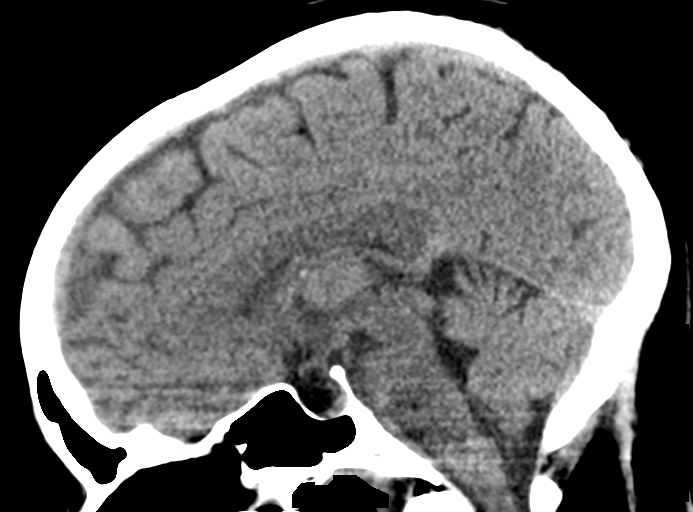
[im 33/49  brain]
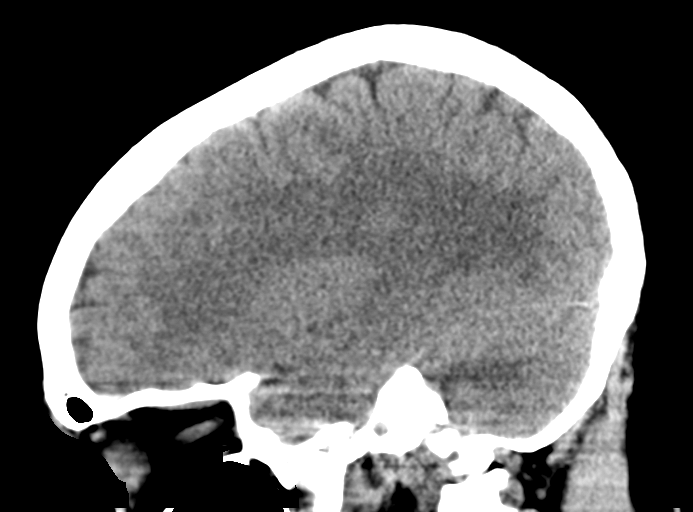

[16 of 47 positions shown; findings below may reference images not displayed]

FINDINGS: Brain: No acute intracranial abnormality. Specifically, no
hemorrhage, hydrocephalus, mass lesion, acute infarction, or
significant intracranial injury.

Vascular: No hyperdense vessel or unexpected calcification.

Skull: No acute calvarial abnormality.

Sinuses/Orbits: Visualized paranasal sinuses and mastoids clear.
Orbital soft tissues unremarkable.

Other: None
IMPRESSION: Normal study.
